# Patient Record
Sex: Male | Born: 1980 | Race: White | Hispanic: No | Marital: Married | Smoking: Former smoker
Health system: Southern US, Community
[De-identification: ages and names within clinical notes are randomized; demographics above are authoritative.]

## PROBLEM LIST (undated history)

## (undated) DIAGNOSIS — Z9889 Other specified postprocedural states: Secondary | ICD-10-CM

## (undated) DIAGNOSIS — K219 Gastro-esophageal reflux disease without esophagitis: Secondary | ICD-10-CM

## (undated) DIAGNOSIS — Z8619 Personal history of other infectious and parasitic diseases: Secondary | ICD-10-CM

## (undated) DIAGNOSIS — E785 Hyperlipidemia, unspecified: Secondary | ICD-10-CM

## (undated) DIAGNOSIS — N419 Inflammatory disease of prostate, unspecified: Secondary | ICD-10-CM

## (undated) DIAGNOSIS — R7611 Nonspecific reaction to tuberculin skin test without active tuberculosis: Secondary | ICD-10-CM

## (undated) DIAGNOSIS — K602 Anal fissure, unspecified: Secondary | ICD-10-CM

## (undated) DIAGNOSIS — I499 Cardiac arrhythmia, unspecified: Secondary | ICD-10-CM

## (undated) DIAGNOSIS — R112 Nausea with vomiting, unspecified: Secondary | ICD-10-CM

## (undated) DIAGNOSIS — M84359A Stress fracture, hip, unspecified, initial encounter for fracture: Secondary | ICD-10-CM

## (undated) HISTORY — DX: Other specified postprocedural states: Z98.890

## (undated) HISTORY — DX: Cardiac arrhythmia, unspecified: I49.9

## (undated) HISTORY — DX: Stress fracture, hip, unspecified, initial encounter for fracture: M84.359A

## (undated) HISTORY — PX: CYST REMOVAL PEDIATRIC: SHX6282

## (undated) HISTORY — DX: Anal fissure, unspecified: K60.2

## (undated) HISTORY — DX: Personal history of other infectious and parasitic diseases: Z86.19

## (undated) HISTORY — DX: Nonspecific reaction to tuberculin skin test without active tuberculosis: R76.11

## (undated) HISTORY — DX: Inflammatory disease of prostate, unspecified: N41.9

## (undated) HISTORY — PX: CARDIAC ELECTROPHYSIOLOGY MAPPING AND ABLATION: SHX1292

## (undated) HISTORY — DX: Gastro-esophageal reflux disease without esophagitis: K21.9

## (undated) HISTORY — DX: Hyperlipidemia, unspecified: E78.5

## (undated) HISTORY — DX: Nausea with vomiting, unspecified: R11.2

---

## 2005-08-02 DIAGNOSIS — R7611 Nonspecific reaction to tuberculin skin test without active tuberculosis: Secondary | ICD-10-CM

## 2005-08-02 HISTORY — DX: Nonspecific reaction to tuberculin skin test without active tuberculosis: R76.11

## 2008-08-02 HISTORY — PX: SPERMATOCELECTOMY: SHX2420

## 2015-02-20 ENCOUNTER — Encounter: Payer: Self-pay | Admitting: Family Medicine

## 2015-02-20 ENCOUNTER — Ambulatory Visit (INDEPENDENT_AMBULATORY_CARE_PROVIDER_SITE_OTHER): Payer: BLUE CROSS/BLUE SHIELD | Admitting: Family Medicine

## 2015-02-20 VITALS — BP 100/52 | HR 60 | Temp 97.5°F | Ht 72.75 in | Wt 197.0 lb

## 2015-02-20 DIAGNOSIS — Z7189 Other specified counseling: Secondary | ICD-10-CM

## 2015-02-20 DIAGNOSIS — M545 Low back pain: Secondary | ICD-10-CM | POA: Diagnosis not present

## 2015-02-20 DIAGNOSIS — Z9889 Other specified postprocedural states: Secondary | ICD-10-CM | POA: Diagnosis not present

## 2015-02-20 DIAGNOSIS — E7801 Familial hypercholesterolemia: Secondary | ICD-10-CM | POA: Insufficient documentation

## 2015-02-20 DIAGNOSIS — E785 Hyperlipidemia, unspecified: Secondary | ICD-10-CM | POA: Diagnosis not present

## 2015-02-20 DIAGNOSIS — Z7689 Persons encountering health services in other specified circumstances: Secondary | ICD-10-CM

## 2015-02-20 DIAGNOSIS — M5442 Lumbago with sciatica, left side: Secondary | ICD-10-CM

## 2015-02-20 DIAGNOSIS — Z8679 Personal history of other diseases of the circulatory system: Secondary | ICD-10-CM

## 2015-02-20 DIAGNOSIS — G8929 Other chronic pain: Secondary | ICD-10-CM | POA: Insufficient documentation

## 2015-02-20 MED ORDER — CRESTOR 20 MG PO TABS: ORAL_TABLET | ORAL | Status: DC

## 2015-02-20 NOTE — Progress Notes (Signed)
Pre visit review using our clinic review tool, if applicable. No additional management support is needed unless otherwise documented below in the visit note. 

## 2015-02-20 NOTE — Patient Instructions (Signed)
BEFORE YOU LEAVE: -schedule physical exam in 3 months - come fasting, can have water and/or black coffee -xray sheet -low back exercises  Get the xrays for your back - if ok, please start the exercises and do these at least 4 days per week - call if any worsening or new symptoms or if not helping  We recommend the following healthy lifestyle measures: - eat a healthy diet consisting of lots of vegetables, fruits, beans, nuts, seeds, healthy meats such as white chicken and fish and whole grains.  - avoid fried foods, fast food, processed foods, sodas, red meet and other fattening foods.  - get a least 150 minutes of aerobic exercise per week.
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
## 2015-02-28 ENCOUNTER — Ambulatory Visit (INDEPENDENT_AMBULATORY_CARE_PROVIDER_SITE_OTHER)
Admission: RE | Admit: 2015-02-28 | Discharge: 2015-02-28 | Disposition: A | Discharge status: Home / Self Care | Payer: BLUE CROSS/BLUE SHIELD | Source: Ambulatory Visit | Attending: Family Medicine | Admitting: Family Medicine

## 2015-02-28 DIAGNOSIS — M545 Low back pain: Secondary | ICD-10-CM

## 2015-05-30 ENCOUNTER — Ambulatory Visit (INDEPENDENT_AMBULATORY_CARE_PROVIDER_SITE_OTHER): Payer: BLUE CROSS/BLUE SHIELD | Admitting: Family Medicine

## 2015-05-30 ENCOUNTER — Encounter: Payer: Self-pay | Admitting: Family Medicine

## 2015-05-30 VITALS — BP 98/70 | HR 59 | Temp 98.5°F | Ht 72.75 in | Wt 192.2 lb

## 2015-05-30 DIAGNOSIS — K122 Cellulitis and abscess of mouth: Secondary | ICD-10-CM | POA: Diagnosis not present

## 2015-05-30 DIAGNOSIS — D239 Other benign neoplasm of skin, unspecified: Secondary | ICD-10-CM

## 2015-05-30 DIAGNOSIS — D229 Melanocytic nevi, unspecified: Secondary | ICD-10-CM

## 2015-05-30 DIAGNOSIS — J069 Acute upper respiratory infection, unspecified: Secondary | ICD-10-CM

## 2015-05-30 DIAGNOSIS — Z9889 Other specified postprocedural states: Secondary | ICD-10-CM

## 2015-05-30 DIAGNOSIS — Z0000 Encounter for general adult medical examination without abnormal findings: Secondary | ICD-10-CM

## 2015-05-30 DIAGNOSIS — E785 Hyperlipidemia, unspecified: Secondary | ICD-10-CM | POA: Diagnosis not present

## 2015-05-30 DIAGNOSIS — Z8679 Personal history of other diseases of the circulatory system: Secondary | ICD-10-CM

## 2015-05-30 LAB — LIPID PANEL
CHOL/HDL RATIO: 5
Cholesterol: 201 mg/dL — ABNORMAL HIGH (ref 0–200)
HDL: 38 mg/dL — AB (ref >39.00)
LDL Cholesterol: 150 mg/dL — ABNORMAL HIGH (ref 0–99)
NONHDL: 162.65
Triglycerides: 65 mg/dL (ref 0.0–149.0)
VLDL: 13 mg/dL (ref 0.0–40.0)

## 2015-05-30 LAB — HEMOGLOBIN A1C: Hgb A1c MFr Bld: 5.2 % (ref 4.6–6.5)

## 2015-05-30 NOTE — Progress Notes (Signed)
Pre visit review using our clinic review tool, if applicable. No additional management support is needed unless otherwise documented below in the visit note. 

## 2015-05-30 NOTE — Patient Instructions (Signed)
BEFORE YOU LEAVE: -labs -follow up in 4 weeks to recheck throat  We recommend the following healthy lifestyle measures: - eat a healthy whole foods diet consisting of regular small meals composed of vegetables, fruits, beans, nuts, seeds, healthy meats such as white chicken and fish and whole grains.  - avoid sweets, white starchy foods, fried foods, fast food, processed foods, sodas, red meet and other fattening foods.  - get a least 150-300 minutes of aerobic exercise per week.   -We have ordered labs or studies at this visit. It can take up to 1-2 weeks for results and processing. We will contact you with instructions IF your results are abnormal. Normal results will be released to your [facility]. If you have not heard from [place] or can not find your results in [facility] in 2 weeks please contact our office.
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
## 2015-06-19 ENCOUNTER — Encounter: Payer: Self-pay | Admitting: Family Medicine

## 2015-06-19 ENCOUNTER — Ambulatory Visit (INDEPENDENT_AMBULATORY_CARE_PROVIDER_SITE_OTHER): Payer: 59 | Admitting: Family Medicine

## 2015-06-19 VITALS — BP 98/62 | HR 84 | Temp 98.4°F | Ht 72.75 in | Wt 193.5 lb

## 2015-06-19 DIAGNOSIS — K122 Cellulitis and abscess of mouth: Secondary | ICD-10-CM | POA: Diagnosis not present

## 2015-06-19 DIAGNOSIS — J069 Acute upper respiratory infection, unspecified: Secondary | ICD-10-CM | POA: Diagnosis not present

## 2015-06-19 MED ORDER — AMOXICILLIN-POT CLAVULANATE 875-125 MG PO TABS: 1.0000 | ORAL_TABLET | Freq: Two times a day (BID) | ORAL | Status: DC

## 2015-06-19 NOTE — Patient Instructions (Signed)
Please take the antibiotic as instructed. If uvula remains swollen call for evaluation with the Ear, Nose and Throat specialist.  INSTRUCTIONS FOR UPPER RESPIRATORY INFECTION:  -plenty of rest and fluids  -nasal saline wash 2-3 times daily (use prepackaged nasal saline or bottled/distilled water if making your own)   -can use AFRIN nasal spray for drainage and nasal congestion - but do NOT use longer then 3-4 days  -can use tylenol (in no history of liver disease) or ibuprofen (if no history of kidney disease, bowel bleeding or significant heart disease) as directed for aches and sorethroat  -in the winter time, using a humidifier at night is helpful (please follow cleaning instructions)  -if you are taking a cough medication - use only as directed, may also try a teaspoon of honey to coat the throat and throat lozenges. If given a cough medication with codeine or hydrocodone or other narcotic please be advised that this contains a strong and  potentially addicting medication. Please follow instructions carefully, take as little as possible and only use AS NEEDED for severe cough. Discuss potential side effects with your pharmacy. Please do not drive or operate machinery while taking these types of medications. Please do not take other sedating medications, drugs or alcohol while taking this medication without discussing with your doctor.  -for sore throat, salt water gargles can help  -follow up if you have fevers, facial pain, tooth pain, difficulty breathing or are worsening or symptoms persist longer then expected  Upper Respiratory Infection, Adult An upper respiratory infection (URI) is also known as the common cold. It is often caused by a type of germ (virus). Colds are easily spread (contagious). You can pass it to others by kissing, coughing, sneezing, or drinking out of the same glass. Usually, you get better in 1 to 3  weeks.  However, the cough can last for even longer. HOME CARE     Only take medicine as told by your doctor. Follow instructions provided above.  Drink enough water and fluids to keep your pee (urine) clear or pale yellow.  Get plenty of rest.  Return to work when your temperature is < 100 for 24 hours or as told by your doctor. You may use a face mask and wash your hands to stop your cold from spreading. GET HELP RIGHT AWAY IF:   After the first few days, you feel you are getting worse.  You have questions about your medicine.  You have chills, shortness of breath, or red spit (mucus).  You have pain in the face for more then 1-2 days, especially when you bend forward.  You have a fever, puffy (swollen) neck, pain when you swallow, or white spots in the back of your throat.  You have a bad headache, ear pain, sinus pain, or chest pain.  You have a high-pitched whistling sound when you breathe in and out (wheezing).  You cough up blood.  You have sore muscles or a stiff neck. MAKE SURE YOU:   Understand these instructions.  Will watch your condition.  Will get help right away if you are not doing well or get worse. Document Released: 01/05/2008 Document Revised: 10/11/2011 Document Reviewed: 10/24/2013 [facility] Patient Information 2015 [place], [place]. This information is not intended to replace advice given to you by your health care provider. Make sure you discuss any questions you have with your health care provider.
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
## 2015-06-19 NOTE — Progress Notes (Signed)
Pre visit review using our clinic review tool, if applicable. No additional management support is needed unless otherwise documented below in the visit note. 

## 2015-06-30 ENCOUNTER — Ambulatory Visit: Payer: Self-pay | Admitting: Family Medicine

## 2015-07-09 ENCOUNTER — Telehealth: Payer: Self-pay | Admitting: Family Medicine

## 2015-07-09 ENCOUNTER — Other Ambulatory Visit: Payer: Self-pay | Admitting: Family Medicine

## 2015-07-09 MED ORDER — CRESTOR 20 MG PO TABS: ORAL_TABLET | ORAL | Status: DC

## 2015-07-09 NOTE — Telephone Encounter (Signed)
Mr. Liechty called saying his pharmacy told him they can't fill his Crestor Rx due to his insurance carrier asking if he has to have that brand as opposed to having a generic. Mr. Waldner said he's fine with taking whatever brand Dr. Maudie Mercury suggests. He's down to two pills. He'd like a phone call regarding this.   Pt ph# [phone] Thank you.

## 2015-07-10 MED ORDER — ROSUVASTATIN CALCIUM 20 MG PO TABS: 20.0000 mg | ORAL_TABLET | Freq: Every day | ORAL | Status: DC

## 2015-07-10 NOTE — Telephone Encounter (Signed)
I called the pharmacy and per Jone Baseman the Rx was sent in as brand only and the insurance may not cover this and if the doctor does not mind, the generic can be sent in. I spoke with Dr Maudie Mercury and she stated the generic is fine if the pt does not mind and the pt stated he will try this. A new Rx was sent for the generic Rx.

## 2015-11-13 ENCOUNTER — Encounter: Payer: Self-pay | Admitting: Family Medicine

## 2015-11-13 ENCOUNTER — Ambulatory Visit (INDEPENDENT_AMBULATORY_CARE_PROVIDER_SITE_OTHER): Payer: 59 | Admitting: Family Medicine

## 2015-11-13 VITALS — BP 100/50 | HR 62 | Temp 98.5°F | Ht 72.75 in | Wt 193.3 lb

## 2015-11-13 DIAGNOSIS — J309 Allergic rhinitis, unspecified: Secondary | ICD-10-CM | POA: Diagnosis not present

## 2015-11-13 MED ORDER — FLUTICASONE PROPIONATE 50 MCG/ACT NA SUSP: 2.0000 | Freq: Every day | NASAL | Status: DC

## 2015-11-13 NOTE — Progress Notes (Signed)
Pre visit review using our clinic review tool, if applicable. No additional management support is needed unless otherwise documented below in the visit note. 
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
## 2015-11-13 NOTE — Patient Instructions (Signed)
Please start zyrtec OR claritin once daily. This is available over the counter.  Please start flonase 2 sprays each nostril daily for 21 days  Follow up with me or your Ear, Nose and throat specialist if symptoms persist in 2-3 weeks.

## 2016-01-08 ENCOUNTER — Other Ambulatory Visit: Payer: Self-pay | Admitting: Family Medicine

## 2016-06-09 ENCOUNTER — Other Ambulatory Visit: Payer: Self-pay | Admitting: Family Medicine
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
## 2016-08-30 ENCOUNTER — Encounter: Payer: Self-pay | Admitting: Family Medicine

## 2016-08-30 ENCOUNTER — Ambulatory Visit (INDEPENDENT_AMBULATORY_CARE_PROVIDER_SITE_OTHER): Payer: 59 | Admitting: Family Medicine

## 2016-08-30 VITALS — BP 98/62 | HR 47 | Temp 98.0°F | Ht 72.75 in | Wt 196.7 lb

## 2016-08-30 DIAGNOSIS — M79671 Pain in right foot: Secondary | ICD-10-CM | POA: Insufficient documentation

## 2016-08-30 DIAGNOSIS — M5442 Lumbago with sciatica, left side: Secondary | ICD-10-CM | POA: Diagnosis not present

## 2016-08-30 DIAGNOSIS — E785 Hyperlipidemia, unspecified: Secondary | ICD-10-CM | POA: Diagnosis not present

## 2016-08-30 DIAGNOSIS — G8929 Other chronic pain: Secondary | ICD-10-CM

## 2016-08-30 DIAGNOSIS — K649 Unspecified hemorrhoids: Secondary | ICD-10-CM | POA: Diagnosis not present

## 2016-08-30 LAB — LIPID PANEL
Cholesterol: 185 mg/dL (ref 0–200)
HDL: 45.9 mg/dL (ref >39.00)
LDL CALC: 129 mg/dL — AB (ref 0–99)
NonHDL: 138.8
Total CHOL/HDL Ratio: 4
Triglycerides: 50 mg/dL (ref 0.0–149.0)
VLDL: 10 mg/dL (ref 0.0–40.0)

## 2016-08-30 MED ORDER — HYDROCORTISONE 2.5 % RE CREA: 1.0000 "application " | TOPICAL_CREAM | Freq: Two times a day (BID) | RECTAL | 0 refills | Status: DC

## 2016-08-30 NOTE — Progress Notes (Signed)
Pre visit review using our clinic review tool, if applicable. No additional management support is needed unless otherwise documented below in the visit note. 

## 2016-08-30 NOTE — Patient Instructions (Signed)
BEFORE YOU LEAVE: -labs -follow up: 3 months  -We placed a referral for you as discussed to the back specialist regarding the back pan. It usually takes about 1-2 weeks to process and schedule this referral. If you have not heard from [place] regarding this appointment in 2 weeks please contact our office.  Try a strassburg sock and gait analysis for the foot pain.  Use the cream provided if needed for hemorrhoids. Please come in for acute visit when you have a flare.  We have ordered labs or studies at this visit. It can take up to 1-2 weeks for results and processing. IF results require follow up or explanation, we will call you with instructions. Clinically stable results will be released to your [facility]. If you have not heard from [place] or cannot find your results in [facility] in 2 weeks please contact our office at [phone].  If you are not yet signed up for [facility], please consider signing up.   We recommend the following healthy lifestyle for LIFE: 1) Small portions.   Tip: eat off of a salad plate instead of a dinner plate.  Tip: It is ok to feel hungry after a meal - that likely means you ate an appropriate portion.  Tip: if you need more or a snack choose fruits, veggies and/or a handful of nuts or seeds.  2) Eat a healthy clean diet.  * Tip: Avoid (less then 1 serving per week): processed foods, sweets, sweetened drinks, white starches (rice, flour, bread, potatoes, pasta, etc), red meat, fast foods, butter  *Tip: CHOOSE instead   * 5-9 servings per day of fresh or frozen fruits and vegetables (but not corn, potatoes, bananas, canned or dried fruit)   *nuts and seeds, beans   *olives and olive oil   *small portions of lean meats such as fish and white chicken    *small portions of whole grains  3)Get at least 150 minutes of sweaty aerobic exercise per week.  4)Reduce stress - consider counseling, meditation and relaxation to balance other aspects of your life.

## 2016-09-02 ENCOUNTER — Other Ambulatory Visit: Payer: Self-pay | Admitting: Family Medicine

## 2016-09-14 ENCOUNTER — Encounter: Payer: Self-pay | Admitting: Family Medicine

## 2016-09-14 ENCOUNTER — Ambulatory Visit (INDEPENDENT_AMBULATORY_CARE_PROVIDER_SITE_OTHER): Payer: 59 | Admitting: Family Medicine

## 2016-09-14 VITALS — BP 100/80 | HR 72 | Temp 98.5°F | Ht 72.75 in | Wt 192.0 lb

## 2016-09-14 DIAGNOSIS — R509 Fever, unspecified: Secondary | ICD-10-CM

## 2016-09-14 LAB — POCT INFLUENZA A/B
Influenza A, POC: NEGATIVE
Influenza B, POC: NEGATIVE

## 2016-09-14 MED ORDER — OSELTAMIVIR PHOSPHATE 75 MG PO CAPS: 75.0000 mg | ORAL_CAPSULE | Freq: Two times a day (BID) | ORAL | 0 refills | Status: DC

## 2016-09-14 NOTE — Patient Instructions (Signed)

## 2016-09-14 NOTE — Progress Notes (Signed)
Pre visit review using our clinic review tool, if applicable. No additional management support is needed unless otherwise documented below in the visit note. 
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
## 2016-09-20 ENCOUNTER — Telehealth: Payer: Self-pay

## 2016-09-20 NOTE — Telephone Encounter (Signed)
error 

## 2016-09-23 ENCOUNTER — Ambulatory Visit (INDEPENDENT_AMBULATORY_CARE_PROVIDER_SITE_OTHER): Payer: 59 | Admitting: Family Medicine

## 2016-09-23 ENCOUNTER — Encounter: Payer: Self-pay | Admitting: Family Medicine

## 2016-09-23 ENCOUNTER — Encounter: Payer: Self-pay | Admitting: Internal Medicine

## 2016-09-23 VITALS — BP 98/62 | HR 55 | Temp 98.1°F | Ht 72.75 in | Wt 192.9 lb

## 2016-09-23 DIAGNOSIS — K6289 Other specified diseases of anus and rectum: Secondary | ICD-10-CM

## 2016-09-23 DIAGNOSIS — K625 Hemorrhage of anus and rectum: Secondary | ICD-10-CM | POA: Diagnosis not present
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
## 2016-09-23 NOTE — Patient Instructions (Signed)
-  We placed a referral for you as discussed to the gastroenterologist. It usually takes about 1-2 weeks to process and schedule this referral. If you have not heard from [place] regarding this appointment in 2 weeks please contact our office.  -Can stop the steroid cream for now and use only if needed

## 2016-09-23 NOTE — Progress Notes (Signed)
Pre visit review using our clinic review tool, if applicable. No additional management support is needed unless otherwise documented below in the visit note. 

## 2016-10-14 ENCOUNTER — Encounter: Payer: Self-pay | Admitting: Physical Medicine & Rehabilitation

## 2016-10-27 ENCOUNTER — Encounter: Payer: Self-pay | Admitting: Internal Medicine

## 2016-10-27 ENCOUNTER — Ambulatory Visit (INDEPENDENT_AMBULATORY_CARE_PROVIDER_SITE_OTHER): Payer: 59 | Admitting: Internal Medicine

## 2016-10-27 VITALS — BP 110/60 | HR 60 | Ht 72.0 in | Wt 193.0 lb

## 2016-10-27 DIAGNOSIS — K602 Anal fissure, unspecified: Secondary | ICD-10-CM | POA: Diagnosis not present

## 2016-10-27 MED ORDER — DILTIAZEM GEL 2 %: 1.0000 "application " | Freq: Every day | CUTANEOUS | 2 refills | Status: DC

## 2016-10-27 NOTE — Patient Instructions (Addendum)
We have sent the following medications to [facility] for you to pick up at your convenience:  Diltizem Gel  Take 2 tablespoons of Metamucil daily  Take sitz baths
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
## 2016-11-05 ENCOUNTER — Encounter: Payer: 59 | Attending: Physical Medicine & Rehabilitation | Admitting: Physical Medicine & Rehabilitation

## 2016-11-05 ENCOUNTER — Encounter: Payer: Self-pay | Admitting: Physical Medicine & Rehabilitation

## 2016-11-05 VITALS — BP 106/70 | HR 46

## 2016-11-05 DIAGNOSIS — M5442 Lumbago with sciatica, left side: Secondary | ICD-10-CM | POA: Diagnosis not present

## 2016-11-05 DIAGNOSIS — G8929 Other chronic pain: Secondary | ICD-10-CM | POA: Diagnosis present

## 2016-11-05 DIAGNOSIS — M791 Myalgia, unspecified site: Secondary | ICD-10-CM

## 2016-11-05 MED ORDER — IBUPROFEN 600 MG PO TABS: 600.0000 mg | ORAL_TABLET | Freq: Every day | ORAL | 1 refills | Status: DC | PRN

## 2016-11-05 MED ORDER — METHOCARBAMOL 500 MG PO TABS: 500.0000 mg | ORAL_TABLET | Freq: Two times a day (BID) | ORAL | 1 refills | Status: DC | PRN
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
## 2016-11-19 ENCOUNTER — Telehealth: Payer: Self-pay | Admitting: *Deleted

## 2016-11-19 DIAGNOSIS — G8929 Other chronic pain: Secondary | ICD-10-CM

## 2016-11-19 DIAGNOSIS — M5442 Lumbago with sciatica, left side: Principal | ICD-10-CM

## 2016-11-19 NOTE — Telephone Encounter (Signed)
New order placed for without contrast.

## 2016-11-24 ENCOUNTER — Telehealth: Payer: Self-pay | Admitting: Family Medicine

## 2016-11-24 NOTE — Telephone Encounter (Signed)
Pt would like to know if he should keep the appointment for Monday due to him going to other providers?

## 2016-11-25 ENCOUNTER — Ambulatory Visit (HOSPITAL_COMMUNITY)
Admission: RE | Admit: 2016-11-25 | Discharge: 2016-11-25 | Disposition: A | Discharge status: Home / Self Care | Payer: 59 | Source: Ambulatory Visit | Attending: Physical Medicine & Rehabilitation | Admitting: Physical Medicine & Rehabilitation

## 2016-11-25 DIAGNOSIS — M5126 Other intervertebral disc displacement, lumbar region: Secondary | ICD-10-CM | POA: Diagnosis not present

## 2016-11-25 DIAGNOSIS — G8929 Other chronic pain: Secondary | ICD-10-CM | POA: Diagnosis not present

## 2016-11-25 DIAGNOSIS — M5442 Lumbago with sciatica, left side: Secondary | ICD-10-CM | POA: Diagnosis not present

## 2016-11-25 NOTE — Telephone Encounter (Signed)
See prior note

## 2016-11-25 NOTE — Telephone Encounter (Signed)
I left a detailed message per Dr Maudie Mercury he can cancel the appt if this would be for the same problem he has seen the specialist for and asked that the pt return my call if he would like to cancel the appt he has and to schedule a physical only.

## 2016-11-25 NOTE — Telephone Encounter (Signed)
Can you call pt? If seeing the specialist for any concerns - can cancel and just schedule his next physical. Thanks!

## 2016-11-29 ENCOUNTER — Ambulatory Visit: Payer: 59 | Admitting: Family Medicine
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
## 2016-11-30 ENCOUNTER — Encounter: Payer: Self-pay | Admitting: Family Medicine

## 2016-11-30 ENCOUNTER — Ambulatory Visit (INDEPENDENT_AMBULATORY_CARE_PROVIDER_SITE_OTHER): Payer: 59 | Admitting: Family Medicine

## 2016-11-30 VITALS — BP 98/60 | HR 50 | Temp 98.4°F | Ht 74.0 in | Wt 194.5 lb

## 2016-11-30 DIAGNOSIS — Z0000 Encounter for general adult medical examination without abnormal findings: Secondary | ICD-10-CM | POA: Diagnosis not present

## 2016-11-30 DIAGNOSIS — E785 Hyperlipidemia, unspecified: Secondary | ICD-10-CM | POA: Diagnosis not present

## 2016-11-30 LAB — LIPID PANEL
Cholesterol: 185 mg/dL (ref 0–200)
HDL: 44.9 mg/dL (ref >39.00)
LDL CALC: 127 mg/dL — AB (ref 0–99)
NONHDL: 140.46
Total CHOL/HDL Ratio: 4
Triglycerides: 67 mg/dL (ref 0.0–149.0)
VLDL: 13.4 mg/dL (ref 0.0–40.0)

## 2016-11-30 NOTE — Patient Instructions (Signed)
BEFORE YOU LEAVE: -follow up: yearly for physical and as needed -lab for cholesterol check  We have ordered labs or studies at this visit. It can take up to 1-2 weeks for results and processing. IF results require follow up or explanation, we will call you with instructions. Clinically stable results will be released to your [facility]. If you have not heard from [place] or cannot find your results in [facility] in 2 weeks please contact our office at [phone].  If you are not yet signed up for [facility], please consider signing up.  Follow up with the dermatologist for the skin and nail check.  Follow up with the neurologist as planned.  We recommend the following healthy lifestyle for LIFE: 1) Small portions.   Tip: eat off of a salad plate instead of a dinner plate.  Tip: if you need more or a snack choose fruits, veggies and/or a handful of nuts or seeds.  2) Eat a healthy clean diet.  * Tip: Avoid (less then 1 serving per week): processed foods, sweets, sweetened drinks, white starches (rice, flour, bread, potatoes, pasta, etc), red meat, fast foods, butter  *Tip: CHOOSE instead   * 5-9 servings per day of fresh or frozen fruits and vegetables (but not corn, potatoes, bananas, canned or dried fruit)   *nuts and seeds, beans   *olives and olive oil   *small portions of lean meats such as fish and white chicken    *small portions of whole grains  3)Get at least 150 minutes of sweaty aerobic exercise per week.  4)Reduce stress - consider counseling, meditation and relaxation to balance other aspects of your life.

## 2016-11-30 NOTE — Progress Notes (Signed)
Pre visit review using our clinic review tool, if applicable. No additional management support is needed unless otherwise documented below in the visit note. 

## 2016-12-03 ENCOUNTER — Encounter: Payer: 59 | Attending: Physical Medicine & Rehabilitation | Admitting: Physical Medicine & Rehabilitation

## 2016-12-03 ENCOUNTER — Encounter: Payer: Self-pay | Admitting: Physical Medicine & Rehabilitation

## 2016-12-03 VITALS — BP 112/72 | HR 55

## 2016-12-03 DIAGNOSIS — M5442 Lumbago with sciatica, left side: Secondary | ICD-10-CM

## 2016-12-03 DIAGNOSIS — M791 Myalgia, unspecified site: Secondary | ICD-10-CM

## 2016-12-03 DIAGNOSIS — G8929 Other chronic pain: Secondary | ICD-10-CM | POA: Diagnosis not present

## 2016-12-03 MED ORDER — GABAPENTIN 100 MG PO CAPS: 100.0000 mg | ORAL_CAPSULE | Freq: Three times a day (TID) | ORAL | 1 refills | Status: DC
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
## 2017-01-14 ENCOUNTER — Encounter: Payer: 59 | Admitting: Physical Medicine & Rehabilitation

## 2017-01-20 ENCOUNTER — Encounter: Payer: 59 | Admitting: Physical Medicine & Rehabilitation

## 2017-01-20 ENCOUNTER — Ambulatory Visit: Payer: 59 | Attending: Physical Medicine & Rehabilitation | Admitting: Physical Therapy

## 2017-01-20 ENCOUNTER — Encounter: Payer: Self-pay | Admitting: Physical Therapy

## 2017-01-20 DIAGNOSIS — M545 Low back pain: Secondary | ICD-10-CM | POA: Diagnosis present

## 2017-01-20 DIAGNOSIS — G8929 Other chronic pain: Secondary | ICD-10-CM | POA: Diagnosis present

## 2017-01-20 DIAGNOSIS — M62838 Other muscle spasm: Secondary | ICD-10-CM | POA: Insufficient documentation
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
## 2017-01-28 ENCOUNTER — Ambulatory Visit: Payer: 59 | Admitting: Physical Therapy

## 2017-01-28 ENCOUNTER — Encounter: Payer: Self-pay | Admitting: Physical Therapy

## 2017-01-28 DIAGNOSIS — M62838 Other muscle spasm: Secondary | ICD-10-CM

## 2017-01-28 DIAGNOSIS — M545 Low back pain: Secondary | ICD-10-CM | POA: Diagnosis not present

## 2017-01-28 DIAGNOSIS — G8929 Other chronic pain: Secondary | ICD-10-CM
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
## 2017-02-10 ENCOUNTER — Ambulatory Visit: Payer: 59 | Admitting: Physical Therapy

## 2017-02-16 ENCOUNTER — Encounter: Payer: Self-pay | Admitting: Physical Therapy

## 2017-02-16 ENCOUNTER — Ambulatory Visit: Payer: 59 | Attending: Physical Medicine & Rehabilitation | Admitting: Physical Therapy

## 2017-02-16 DIAGNOSIS — G8929 Other chronic pain: Secondary | ICD-10-CM | POA: Insufficient documentation

## 2017-02-16 DIAGNOSIS — M545 Low back pain: Secondary | ICD-10-CM | POA: Diagnosis not present

## 2017-02-16 DIAGNOSIS — M62838 Other muscle spasm: Secondary | ICD-10-CM | POA: Insufficient documentation
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
## 2017-02-18 ENCOUNTER — Encounter: Payer: Self-pay | Admitting: Physical Medicine & Rehabilitation

## 2017-02-18 ENCOUNTER — Encounter: Payer: 59 | Attending: Physical Medicine & Rehabilitation | Admitting: Physical Medicine & Rehabilitation

## 2017-02-18 VITALS — BP 106/69 | HR 64

## 2017-02-18 DIAGNOSIS — G8929 Other chronic pain: Secondary | ICD-10-CM | POA: Insufficient documentation

## 2017-02-18 DIAGNOSIS — M791 Myalgia, unspecified site: Secondary | ICD-10-CM

## 2017-02-18 DIAGNOSIS — M5442 Lumbago with sciatica, left side: Secondary | ICD-10-CM | POA: Insufficient documentation

## 2017-02-18 MED ORDER — IBUPROFEN 600 MG PO TABS: 600.0000 mg | ORAL_TABLET | Freq: Every day | ORAL | 2 refills | Status: DC | PRN
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
## 2017-02-23 ENCOUNTER — Encounter: Payer: Self-pay | Admitting: Physical Therapy

## 2017-02-23 ENCOUNTER — Ambulatory Visit: Payer: 59 | Admitting: Physical Therapy

## 2017-02-23 DIAGNOSIS — M62838 Other muscle spasm: Secondary | ICD-10-CM

## 2017-02-23 DIAGNOSIS — M545 Low back pain: Secondary | ICD-10-CM | POA: Diagnosis not present

## 2017-02-23 DIAGNOSIS — G8929 Other chronic pain: Secondary | ICD-10-CM

## 2017-02-23 NOTE — Patient Instructions (Signed)
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
## 2017-03-10 ENCOUNTER — Ambulatory Visit: Payer: 59 | Attending: Physical Medicine & Rehabilitation

## 2017-03-10 DIAGNOSIS — G8929 Other chronic pain: Secondary | ICD-10-CM | POA: Diagnosis present

## 2017-03-10 DIAGNOSIS — M545 Low back pain: Secondary | ICD-10-CM | POA: Diagnosis not present

## 2017-03-10 DIAGNOSIS — M62838 Other muscle spasm: Secondary | ICD-10-CM | POA: Insufficient documentation

## 2017-03-10 NOTE — Addendum Note (Signed)
Addended by: Darrel Hoover on: 03/10/2017 09:34 AM   Modules accepted: Orders
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
## 2017-03-16 ENCOUNTER — Encounter: Payer: Self-pay | Admitting: Physical Therapy

## 2017-03-16 ENCOUNTER — Ambulatory Visit: Payer: 59 | Admitting: Physical Therapy

## 2017-03-16 DIAGNOSIS — M62838 Other muscle spasm: Secondary | ICD-10-CM

## 2017-03-16 DIAGNOSIS — M545 Low back pain: Secondary | ICD-10-CM | POA: Diagnosis not present

## 2017-03-16 DIAGNOSIS — G8929 Other chronic pain: Secondary | ICD-10-CM
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
## 2017-03-22 ENCOUNTER — Encounter: Payer: Self-pay | Admitting: Physical Therapy

## 2017-03-22 ENCOUNTER — Ambulatory Visit: Payer: 59 | Admitting: Physical Therapy

## 2017-03-22 DIAGNOSIS — G8929 Other chronic pain: Secondary | ICD-10-CM

## 2017-03-22 DIAGNOSIS — M62838 Other muscle spasm: Secondary | ICD-10-CM

## 2017-03-22 DIAGNOSIS — M545 Low back pain: Secondary | ICD-10-CM
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
## 2017-03-30 ENCOUNTER — Ambulatory Visit: Payer: 59 | Admitting: Physical Therapy

## 2017-03-30 ENCOUNTER — Encounter: Payer: Self-pay | Admitting: Physical Therapy

## 2017-03-30 DIAGNOSIS — M62838 Other muscle spasm: Secondary | ICD-10-CM

## 2017-03-30 DIAGNOSIS — G8929 Other chronic pain: Secondary | ICD-10-CM

## 2017-03-30 DIAGNOSIS — M545 Low back pain: Secondary | ICD-10-CM | POA: Diagnosis not present
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
## 2017-04-06 ENCOUNTER — Ambulatory Visit: Payer: 59

## 2017-04-07 ENCOUNTER — Ambulatory Visit: Payer: 59 | Attending: Physical Medicine & Rehabilitation | Admitting: Physical Therapy

## 2017-04-07 ENCOUNTER — Encounter: Payer: Self-pay | Admitting: Physical Therapy

## 2017-04-07 DIAGNOSIS — M62838 Other muscle spasm: Secondary | ICD-10-CM | POA: Insufficient documentation

## 2017-04-07 DIAGNOSIS — G8929 Other chronic pain: Secondary | ICD-10-CM | POA: Diagnosis present

## 2017-04-07 DIAGNOSIS — M545 Low back pain: Secondary | ICD-10-CM | POA: Insufficient documentation
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
## 2017-04-13 ENCOUNTER — Encounter: Payer: Self-pay | Admitting: Physical Therapy

## 2017-04-13 ENCOUNTER — Ambulatory Visit: Payer: 59 | Admitting: Physical Therapy

## 2017-04-13 DIAGNOSIS — M62838 Other muscle spasm: Secondary | ICD-10-CM

## 2017-04-13 DIAGNOSIS — G8929 Other chronic pain: Secondary | ICD-10-CM

## 2017-04-13 DIAGNOSIS — M545 Low back pain: Secondary | ICD-10-CM
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
## 2017-04-21 ENCOUNTER — Encounter: Payer: Self-pay | Admitting: Family Medicine

## 2017-04-22 ENCOUNTER — Encounter: Payer: Self-pay | Admitting: Physical Therapy

## 2017-04-22 ENCOUNTER — Ambulatory Visit: Payer: 59 | Admitting: Physical Therapy

## 2017-04-22 DIAGNOSIS — M62838 Other muscle spasm: Secondary | ICD-10-CM | POA: Diagnosis not present

## 2017-04-22 DIAGNOSIS — G8929 Other chronic pain: Secondary | ICD-10-CM

## 2017-04-22 DIAGNOSIS — M545 Low back pain: Secondary | ICD-10-CM
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
## 2017-05-20 ENCOUNTER — Encounter: Payer: 59 | Attending: Physical Medicine & Rehabilitation | Admitting: Physical Medicine & Rehabilitation

## 2017-05-20 ENCOUNTER — Encounter: Payer: Self-pay | Admitting: Physical Medicine & Rehabilitation

## 2017-05-20 VITALS — BP 131/73 | HR 59

## 2017-05-20 DIAGNOSIS — G8929 Other chronic pain: Secondary | ICD-10-CM | POA: Diagnosis not present

## 2017-05-20 DIAGNOSIS — Z9889 Other specified postprocedural states: Secondary | ICD-10-CM | POA: Insufficient documentation

## 2017-05-20 DIAGNOSIS — M791 Myalgia, unspecified site: Secondary | ICD-10-CM | POA: Diagnosis not present

## 2017-05-20 DIAGNOSIS — M2170 Unequal limb length (acquired), unspecified site: Secondary | ICD-10-CM | POA: Diagnosis not present

## 2017-05-20 DIAGNOSIS — K219 Gastro-esophageal reflux disease without esophagitis: Secondary | ICD-10-CM | POA: Diagnosis not present

## 2017-05-20 DIAGNOSIS — M5442 Lumbago with sciatica, left side: Secondary | ICD-10-CM | POA: Diagnosis not present

## 2017-05-20 DIAGNOSIS — Z87891 Personal history of nicotine dependence: Secondary | ICD-10-CM | POA: Diagnosis not present

## 2017-05-20 DIAGNOSIS — I4891 Unspecified atrial fibrillation: Secondary | ICD-10-CM | POA: Insufficient documentation

## 2017-05-20 DIAGNOSIS — E785 Hyperlipidemia, unspecified: Secondary | ICD-10-CM | POA: Diagnosis not present

## 2017-05-20 MED ORDER — METHOCARBAMOL 500 MG PO TABS: 500.0000 mg | ORAL_TABLET | Freq: Two times a day (BID) | ORAL | 1 refills | Status: DC | PRN

## 2017-05-20 MED ORDER — IBUPROFEN 600 MG PO TABS: 600.0000 mg | ORAL_TABLET | Freq: Every day | ORAL | 2 refills | Status: DC | PRN

## 2017-05-20 MED ORDER — GABAPENTIN 100 MG PO CAPS: 100.0000 mg | ORAL_CAPSULE | Freq: Three times a day (TID) | ORAL | 1 refills | Status: DC | PRN
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
## 2017-05-20 NOTE — Addendum Note (Signed)
Addended by: Delice Lesch A on: 05/20/2017 01:39 PM   Modules accepted: Level of Service

## 2017-05-23 ENCOUNTER — Other Ambulatory Visit: Payer: Self-pay | Admitting: Family Medicine

## 2017-05-23 ENCOUNTER — Telehealth: Payer: Self-pay | Admitting: Physical Medicine & Rehabilitation

## 2017-05-23 NOTE — Telephone Encounter (Signed)
Patient needs referral for orthotic expedited if possible healthcare year expiring - call to confirm you can please. [phone]

## 2017-06-30 ENCOUNTER — Encounter: Payer: Self-pay | Admitting: Physical Medicine & Rehabilitation

## 2017-06-30 ENCOUNTER — Other Ambulatory Visit: Payer: Self-pay

## 2017-06-30 ENCOUNTER — Encounter: Payer: 59 | Attending: Physical Medicine & Rehabilitation | Admitting: Physical Medicine & Rehabilitation

## 2017-06-30 ENCOUNTER — Encounter: Payer: 59 | Admitting: Physical Medicine & Rehabilitation

## 2017-06-30 VITALS — BP 109/72 | HR 60

## 2017-06-30 DIAGNOSIS — K219 Gastro-esophageal reflux disease without esophagitis: Secondary | ICD-10-CM | POA: Diagnosis not present

## 2017-06-30 DIAGNOSIS — M5442 Lumbago with sciatica, left side: Secondary | ICD-10-CM | POA: Insufficient documentation

## 2017-06-30 DIAGNOSIS — I4891 Unspecified atrial fibrillation: Secondary | ICD-10-CM | POA: Insufficient documentation

## 2017-06-30 DIAGNOSIS — G8929 Other chronic pain: Secondary | ICD-10-CM | POA: Diagnosis not present

## 2017-06-30 DIAGNOSIS — Z9889 Other specified postprocedural states: Secondary | ICD-10-CM | POA: Diagnosis not present

## 2017-06-30 DIAGNOSIS — E785 Hyperlipidemia, unspecified: Secondary | ICD-10-CM | POA: Diagnosis not present

## 2017-06-30 DIAGNOSIS — Z87891 Personal history of nicotine dependence: Secondary | ICD-10-CM | POA: Insufficient documentation

## 2017-06-30 DIAGNOSIS — M2170 Unequal limb length (acquired), unspecified site: Secondary | ICD-10-CM | POA: Diagnosis not present

## 2017-06-30 DIAGNOSIS — M791 Myalgia, unspecified site: Secondary | ICD-10-CM | POA: Diagnosis not present

## 2017-06-30 MED ORDER — LIDOCAINE 5 % EX PTCH: 1.0000 | MEDICATED_PATCH | CUTANEOUS | 0 refills | Status: DC

## 2017-06-30 MED ORDER — GABAPENTIN 300 MG PO CAPS: 300.0000 mg | ORAL_CAPSULE | Freq: Three times a day (TID) | ORAL | 1 refills | Status: DC
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
## 2017-08-03 ENCOUNTER — Other Ambulatory Visit: Payer: Self-pay

## 2017-08-03 ENCOUNTER — Encounter: Payer: 59 | Attending: Physical Medicine & Rehabilitation | Admitting: Physical Medicine & Rehabilitation

## 2017-08-03 ENCOUNTER — Encounter: Payer: Self-pay | Admitting: Physical Medicine & Rehabilitation

## 2017-08-03 VITALS — BP 112/73 | HR 47

## 2017-08-03 DIAGNOSIS — M5442 Lumbago with sciatica, left side: Secondary | ICD-10-CM | POA: Insufficient documentation

## 2017-08-03 DIAGNOSIS — M2170 Unequal limb length (acquired), unspecified site: Secondary | ICD-10-CM | POA: Diagnosis not present

## 2017-08-03 DIAGNOSIS — M791 Myalgia, unspecified site: Secondary | ICD-10-CM | POA: Diagnosis not present

## 2017-08-03 DIAGNOSIS — G8929 Other chronic pain: Secondary | ICD-10-CM | POA: Insufficient documentation

## 2017-08-03 DIAGNOSIS — Z9889 Other specified postprocedural states: Secondary | ICD-10-CM | POA: Insufficient documentation

## 2017-08-03 DIAGNOSIS — K219 Gastro-esophageal reflux disease without esophagitis: Secondary | ICD-10-CM | POA: Insufficient documentation

## 2017-08-03 DIAGNOSIS — M5417 Radiculopathy, lumbosacral region: Secondary | ICD-10-CM | POA: Diagnosis not present

## 2017-08-03 DIAGNOSIS — E785 Hyperlipidemia, unspecified: Secondary | ICD-10-CM | POA: Diagnosis not present

## 2017-08-03 DIAGNOSIS — Z87891 Personal history of nicotine dependence: Secondary | ICD-10-CM | POA: Diagnosis not present

## 2017-08-03 DIAGNOSIS — I4891 Unspecified atrial fibrillation: Secondary | ICD-10-CM | POA: Insufficient documentation

## 2017-08-03 MED ORDER — GABAPENTIN 300 MG PO CAPS: 600.0000 mg | ORAL_CAPSULE | Freq: Three times a day (TID) | ORAL | 1 refills | Status: DC

## 2017-08-03 MED ORDER — IBUPROFEN 600 MG PO TABS: 600.0000 mg | ORAL_TABLET | Freq: Every day | ORAL | 2 refills | Status: DC | PRN
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
## 2017-08-11 ENCOUNTER — Encounter: Payer: Self-pay | Admitting: Family Medicine

## 2017-08-12 ENCOUNTER — Encounter: Payer: 59 | Admitting: Physical Medicine & Rehabilitation

## 2017-08-17 ENCOUNTER — Encounter: Payer: 59 | Admitting: Physical Medicine & Rehabilitation

## 2017-08-17 ENCOUNTER — Encounter: Payer: Self-pay | Admitting: Physical Medicine & Rehabilitation

## 2017-08-17 VITALS — BP 122/80 | HR 58

## 2017-08-17 DIAGNOSIS — G8929 Other chronic pain: Secondary | ICD-10-CM | POA: Diagnosis not present

## 2017-08-17 DIAGNOSIS — M791 Myalgia, unspecified site: Secondary | ICD-10-CM | POA: Diagnosis not present

## 2017-08-17 NOTE — Progress Notes (Signed)
Trigger point injection procedure note: Trigger Point Injection: Written consent was obtained for the patient. Trigger points were identified of left lumbosacral PSPs and gluteal muscles (x3). The areas were cleaned with alcohol, vapocoolant spray applied, needle draw back was performed, and each of  these trigger points were injected with 1 cc of 0.5% Marcaine. There were no complications from the procedure, and it was well tolerated.

## 2017-08-31 ENCOUNTER — Encounter: Payer: Self-pay | Admitting: Physical Medicine & Rehabilitation

## 2017-08-31 ENCOUNTER — Encounter: Payer: 59 | Admitting: Physical Medicine & Rehabilitation

## 2017-08-31 VITALS — BP 110/75 | HR 53

## 2017-08-31 DIAGNOSIS — M791 Myalgia, unspecified site: Secondary | ICD-10-CM

## 2017-08-31 DIAGNOSIS — M2170 Unequal limb length (acquired), unspecified site: Secondary | ICD-10-CM | POA: Diagnosis not present

## 2017-08-31 DIAGNOSIS — M5417 Radiculopathy, lumbosacral region: Secondary | ICD-10-CM

## 2017-08-31 DIAGNOSIS — G8929 Other chronic pain: Secondary | ICD-10-CM

## 2017-08-31 DIAGNOSIS — M5442 Lumbago with sciatica, left side: Secondary | ICD-10-CM | POA: Diagnosis not present

## 2017-08-31 MED ORDER — IBUPROFEN 600 MG PO TABS: 600.0000 mg | ORAL_TABLET | Freq: Every day | ORAL | 2 refills | Status: DC | PRN

## 2017-08-31 MED ORDER — BACLOFEN 10 MG PO TABS: 20.0000 mg | ORAL_TABLET | Freq: Three times a day (TID) | ORAL | 1 refills | Status: DC | PRN

## 2017-08-31 MED ORDER — DULOXETINE HCL 30 MG PO CPEP: 30.0000 mg | ORAL_CAPSULE | Freq: Every day | ORAL | 1 refills | Status: DC

## 2017-08-31 MED ORDER — GABAPENTIN 300 MG PO CAPS: 900.0000 mg | ORAL_CAPSULE | Freq: Three times a day (TID) | ORAL | 1 refills | Status: DC
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
## 2017-09-09 ENCOUNTER — Encounter: Payer: 59 | Attending: Physical Medicine & Rehabilitation

## 2017-09-09 ENCOUNTER — Ambulatory Visit: Payer: 59 | Admitting: Physical Medicine & Rehabilitation

## 2017-09-09 ENCOUNTER — Encounter: Payer: Self-pay | Admitting: Physical Medicine & Rehabilitation

## 2017-09-09 VITALS — BP 120/78 | HR 53 | Resp 14

## 2017-09-09 DIAGNOSIS — I4891 Unspecified atrial fibrillation: Secondary | ICD-10-CM | POA: Diagnosis not present

## 2017-09-09 DIAGNOSIS — M5442 Lumbago with sciatica, left side: Secondary | ICD-10-CM | POA: Diagnosis present

## 2017-09-09 DIAGNOSIS — G8929 Other chronic pain: Secondary | ICD-10-CM | POA: Diagnosis not present

## 2017-09-09 DIAGNOSIS — M5417 Radiculopathy, lumbosacral region: Secondary | ICD-10-CM | POA: Diagnosis not present

## 2017-09-09 DIAGNOSIS — E785 Hyperlipidemia, unspecified: Secondary | ICD-10-CM | POA: Diagnosis not present

## 2017-09-09 DIAGNOSIS — M791 Myalgia, unspecified site: Secondary | ICD-10-CM | POA: Insufficient documentation

## 2017-09-09 DIAGNOSIS — Z87891 Personal history of nicotine dependence: Secondary | ICD-10-CM | POA: Insufficient documentation

## 2017-09-09 DIAGNOSIS — Z9889 Other specified postprocedural states: Secondary | ICD-10-CM | POA: Diagnosis not present

## 2017-09-09 DIAGNOSIS — K219 Gastro-esophageal reflux disease without esophagitis: Secondary | ICD-10-CM | POA: Insufficient documentation

## 2017-09-09 NOTE — Patient Instructions (Signed)

## 2017-09-09 NOTE — Progress Notes (Signed)
  PROCEDURE RECORD [facility]   Name: Jayveon Convey DOB:1981-05-03 MRN: [id number]  Date:09/09/2017  Physician: Alysia Penna, MD    Nurse/CMA: Aleane Wesenberg, CMA  Allergies: No Known Allergies  Consent Signed: Yes.    Is patient diabetic? No.  CBG today?   Pregnant: No. LMP: No LMP for male patient. (age 39-55)  Anticoagulants: no Anti-inflammatory: no Antibiotics: no  Procedure:left  transforaminal epidural steroid injecrtion  Position: Prone Start Time:4:05pm   End Time: 4:12pm  Fluoro Time: 25s  RN/CMA Shalea Tomczak, CMA Damione Robideau, CMA    Time 3:45pm 4:18pm    BP 120/78 118/77    Pulse 53 48    Respirations 16 16    O2 Sat 97 96    S/S 6 6    Pain Level 3/10 0/10     D/C home with wife, patient A & O X 3, D/C instructions reviewed, and sits independently.

## 2017-09-16 ENCOUNTER — Telehealth: Payer: Self-pay | Admitting: Physical Medicine & Rehabilitation

## 2017-09-16 NOTE — Telephone Encounter (Signed)
He should try taking it with food.  If he is already doing so, then he can d/c it.   Thanks.

## 2017-09-16 NOTE — Telephone Encounter (Signed)
Patient taking Duloxetine and it is making him very sleepy. Would like to know if he can stop taking it.  Please call patient at [phone].

## 2017-09-19 NOTE — Telephone Encounter (Signed)
Message left on VM per DPR. 

## 2017-10-05 ENCOUNTER — Encounter: Payer: Self-pay | Admitting: Physical Medicine & Rehabilitation

## 2017-10-05 ENCOUNTER — Encounter: Payer: 59 | Attending: Physical Medicine & Rehabilitation | Admitting: Physical Medicine & Rehabilitation

## 2017-10-05 VITALS — BP 113/77 | HR 56 | Resp 14

## 2017-10-05 DIAGNOSIS — M5417 Radiculopathy, lumbosacral region: Secondary | ICD-10-CM

## 2017-10-05 DIAGNOSIS — M2170 Unequal limb length (acquired), unspecified site: Secondary | ICD-10-CM | POA: Diagnosis not present

## 2017-10-05 DIAGNOSIS — M791 Myalgia, unspecified site: Secondary | ICD-10-CM

## 2017-10-05 DIAGNOSIS — G8929 Other chronic pain: Secondary | ICD-10-CM

## 2017-10-05 DIAGNOSIS — M5442 Lumbago with sciatica, left side: Secondary | ICD-10-CM | POA: Diagnosis present

## 2017-10-05 DIAGNOSIS — I4891 Unspecified atrial fibrillation: Secondary | ICD-10-CM | POA: Insufficient documentation

## 2017-10-05 DIAGNOSIS — K219 Gastro-esophageal reflux disease without esophagitis: Secondary | ICD-10-CM | POA: Diagnosis not present

## 2017-10-05 DIAGNOSIS — E785 Hyperlipidemia, unspecified: Secondary | ICD-10-CM | POA: Insufficient documentation

## 2017-10-05 DIAGNOSIS — Z87891 Personal history of nicotine dependence: Secondary | ICD-10-CM | POA: Insufficient documentation

## 2017-10-05 DIAGNOSIS — Z9889 Other specified postprocedural states: Secondary | ICD-10-CM | POA: Insufficient documentation

## 2017-10-05 MED ORDER — DULOXETINE HCL 60 MG PO CPEP: 60.0000 mg | ORAL_CAPSULE | Freq: Every day | ORAL | 1 refills | Status: DC

## 2017-10-05 MED ORDER — GABAPENTIN 600 MG PO TABS: 1200.0000 mg | ORAL_TABLET | Freq: Three times a day (TID) | ORAL | 1 refills | Status: DC

## 2017-10-05 MED ORDER — BACLOFEN 10 MG PO TABS: 20.0000 mg | ORAL_TABLET | Freq: Three times a day (TID) | ORAL | 1 refills | Status: DC | PRN
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
## 2017-10-21 ENCOUNTER — Encounter: Payer: Self-pay | Admitting: Physical Medicine & Rehabilitation

## 2017-10-21 ENCOUNTER — Ambulatory Visit (HOSPITAL_BASED_OUTPATIENT_CLINIC_OR_DEPARTMENT_OTHER): Payer: 59 | Admitting: Physical Medicine & Rehabilitation

## 2017-10-21 VITALS — BP 122/76 | HR 61 | Resp 14

## 2017-10-21 DIAGNOSIS — M5417 Radiculopathy, lumbosacral region: Secondary | ICD-10-CM | POA: Diagnosis not present

## 2017-10-21 DIAGNOSIS — G8929 Other chronic pain: Secondary | ICD-10-CM | POA: Diagnosis not present

## 2017-10-21 NOTE — Progress Notes (Signed)
  PROCEDURE RECORD [facility]   Name: George Singleton DOB:January 22, 1981 MRN: [id number]  Date:10/21/2017  Physician: Alysia Penna, MD    Nurse/CMA: Rucha Wissinger, CMA   Allergies: No Known Allergies  Consent Signed: Yes.    Is patient diabetic? No.  CBG today?   Pregnant: No. LMP: No LMP for male patient. (age 39-55)  Anticoagulants: no Anti-inflammatory: no Antibiotics: no  Procedure: left S1 transforaminal epidural steroid injection  Position: Prone Start Time: 2:05pm  End Time:2:11pm   Fluoro Time: 36  RN/CMA Emerie Vanderkolk, CMA Toiya Morrish, CMA    Time 1:50pm 2:16pm    BP 122/76 120/76    Pulse 61 63    Respirations 14 14    O2 Sat 96 97    S/S 6 6    Pain Level 3/10 1/10     D/C home with wife, patient A & O X 3, D/C instructions reviewed, and sits independently.

## 2017-10-21 NOTE — Progress Notes (Signed)
LEFT S1 Sacral transforaminal epidural steroid injection under fluoroscopic guidance  Indication: Lumbosacral radiculitis is not relieved by medication management or other conservative care and interfering with self-care and mobility.  Prior Left S1 ESI performed 09/09/17 resulted in partial relief of Left radicular pain in Left  LE to foot  Informed consent was obtained after describing risk and benefits of the procedure with the patient, this includes bleeding, bruising, infection, paralysis and medication side effects.  The patient wishes to proceed and has given written consent.  Patient was placed in prone position.  The lumbar area was marked and prepped with Betadine.  It was entered with a 25-gauge 1-1/2 inch needle and one mL of 1% lidocaine was injected into the skin and subcutaneous tissue.  Then a 22-gauge 3.5 inch spinal needle was inserted into the Left S1 sacral foramen under AP, lateral, and oblique view.  Then a solution containing one mL of 10 mg per mL dexamethasone and 2 mL of 1% lidocaine was injected.  The patient tolerated procedure well.  Post procedure instructions were given.  Please see post procedure form.

## 2017-10-21 NOTE — Patient Instructions (Signed)

## 2017-11-02 ENCOUNTER — Encounter: Payer: Self-pay | Admitting: Physical Medicine & Rehabilitation

## 2017-11-02 ENCOUNTER — Encounter: Payer: 59 | Attending: Physical Medicine & Rehabilitation | Admitting: Physical Medicine & Rehabilitation

## 2017-11-02 VITALS — BP 118/79 | HR 66 | Resp 14 | Ht 73.0 in | Wt 185.0 lb

## 2017-11-02 DIAGNOSIS — E785 Hyperlipidemia, unspecified: Secondary | ICD-10-CM | POA: Insufficient documentation

## 2017-11-02 DIAGNOSIS — G8929 Other chronic pain: Secondary | ICD-10-CM | POA: Diagnosis not present

## 2017-11-02 DIAGNOSIS — Z87891 Personal history of nicotine dependence: Secondary | ICD-10-CM | POA: Insufficient documentation

## 2017-11-02 DIAGNOSIS — Z9889 Other specified postprocedural states: Secondary | ICD-10-CM | POA: Insufficient documentation

## 2017-11-02 DIAGNOSIS — I4891 Unspecified atrial fibrillation: Secondary | ICD-10-CM | POA: Diagnosis not present

## 2017-11-02 DIAGNOSIS — K219 Gastro-esophageal reflux disease without esophagitis: Secondary | ICD-10-CM | POA: Diagnosis not present

## 2017-11-02 DIAGNOSIS — M2170 Unequal limb length (acquired), unspecified site: Secondary | ICD-10-CM | POA: Diagnosis not present

## 2017-11-02 DIAGNOSIS — M5417 Radiculopathy, lumbosacral region: Secondary | ICD-10-CM

## 2017-11-02 DIAGNOSIS — M5442 Lumbago with sciatica, left side: Secondary | ICD-10-CM | POA: Insufficient documentation

## 2017-11-02 DIAGNOSIS — M791 Myalgia, unspecified site: Secondary | ICD-10-CM | POA: Insufficient documentation

## 2017-11-02 MED ORDER — PREGABALIN 100 MG PO CAPS
100.0000 mg | ORAL_CAPSULE | Freq: Three times a day (TID) | ORAL | 1 refills | Status: DC
Start: 2017-11-02 — End: 2017-11-30
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
## 2017-11-03 ENCOUNTER — Telehealth: Payer: Self-pay

## 2017-11-03 NOTE — Telephone Encounter (Signed)
Patient called, stated pharmacist told him he will need prior auth for his lyrica.

## 2017-11-04 NOTE — Telephone Encounter (Signed)
Pt  Lyrica has been approved for coverage until 11/05/2018. Pharmacy has been notified.

## 2017-11-30 ENCOUNTER — Encounter: Payer: 59 | Attending: Physical Medicine & Rehabilitation | Admitting: Physical Medicine & Rehabilitation

## 2017-11-30 ENCOUNTER — Other Ambulatory Visit: Payer: Self-pay

## 2017-11-30 ENCOUNTER — Encounter: Payer: Self-pay | Admitting: Physical Medicine & Rehabilitation

## 2017-11-30 VITALS — BP 113/75 | HR 56 | Ht 73.0 in | Wt 200.6 lb

## 2017-11-30 DIAGNOSIS — I4891 Unspecified atrial fibrillation: Secondary | ICD-10-CM | POA: Insufficient documentation

## 2017-11-30 DIAGNOSIS — M5442 Lumbago with sciatica, left side: Secondary | ICD-10-CM | POA: Insufficient documentation

## 2017-11-30 DIAGNOSIS — M5417 Radiculopathy, lumbosacral region: Secondary | ICD-10-CM

## 2017-11-30 DIAGNOSIS — K219 Gastro-esophageal reflux disease without esophagitis: Secondary | ICD-10-CM | POA: Insufficient documentation

## 2017-11-30 DIAGNOSIS — Z9889 Other specified postprocedural states: Secondary | ICD-10-CM | POA: Diagnosis not present

## 2017-11-30 DIAGNOSIS — G8929 Other chronic pain: Secondary | ICD-10-CM | POA: Diagnosis not present

## 2017-11-30 DIAGNOSIS — M2170 Unequal limb length (acquired), unspecified site: Secondary | ICD-10-CM | POA: Diagnosis not present

## 2017-11-30 DIAGNOSIS — Z87891 Personal history of nicotine dependence: Secondary | ICD-10-CM | POA: Insufficient documentation

## 2017-11-30 DIAGNOSIS — M791 Myalgia, unspecified site: Secondary | ICD-10-CM | POA: Diagnosis not present

## 2017-11-30 DIAGNOSIS — E785 Hyperlipidemia, unspecified: Secondary | ICD-10-CM | POA: Insufficient documentation

## 2017-11-30 MED ORDER — BACLOFEN 10 MG PO TABS: 20.0000 mg | ORAL_TABLET | Freq: Three times a day (TID) | ORAL | 1 refills | Status: DC | PRN

## 2017-11-30 MED ORDER — PREGABALIN 200 MG PO CAPS: 200.0000 mg | ORAL_CAPSULE | Freq: Three times a day (TID) | ORAL | 1 refills | Status: DC

## 2017-11-30 NOTE — Addendum Note (Signed)
Addended by: Delice Lesch A on: 11/30/2017 09:56 AM   Modules accepted: Orders
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
## 2017-12-13 ENCOUNTER — Other Ambulatory Visit: Payer: Self-pay | Admitting: Physical Medicine & Rehabilitation

## 2018-01-06 ENCOUNTER — Encounter: Payer: 59 | Attending: Physical Medicine & Rehabilitation | Admitting: Physical Medicine & Rehabilitation

## 2018-01-06 ENCOUNTER — Other Ambulatory Visit: Payer: Self-pay

## 2018-01-06 ENCOUNTER — Encounter: Payer: Self-pay | Admitting: Physical Medicine & Rehabilitation

## 2018-01-06 VITALS — BP 122/77 | HR 55 | Ht 73.0 in | Wt 206.6 lb

## 2018-01-06 DIAGNOSIS — I4891 Unspecified atrial fibrillation: Secondary | ICD-10-CM | POA: Insufficient documentation

## 2018-01-06 DIAGNOSIS — G8929 Other chronic pain: Secondary | ICD-10-CM | POA: Diagnosis not present

## 2018-01-06 DIAGNOSIS — M5442 Lumbago with sciatica, left side: Secondary | ICD-10-CM | POA: Diagnosis present

## 2018-01-06 DIAGNOSIS — E785 Hyperlipidemia, unspecified: Secondary | ICD-10-CM | POA: Diagnosis not present

## 2018-01-06 DIAGNOSIS — M2170 Unequal limb length (acquired), unspecified site: Secondary | ICD-10-CM

## 2018-01-06 DIAGNOSIS — Z9889 Other specified postprocedural states: Secondary | ICD-10-CM | POA: Diagnosis not present

## 2018-01-06 DIAGNOSIS — M5417 Radiculopathy, lumbosacral region: Secondary | ICD-10-CM | POA: Diagnosis not present

## 2018-01-06 DIAGNOSIS — Z87891 Personal history of nicotine dependence: Secondary | ICD-10-CM | POA: Diagnosis not present

## 2018-01-06 DIAGNOSIS — K219 Gastro-esophageal reflux disease without esophagitis: Secondary | ICD-10-CM | POA: Diagnosis not present

## 2018-01-06 DIAGNOSIS — M791 Myalgia, unspecified site: Secondary | ICD-10-CM | POA: Insufficient documentation

## 2018-01-06 MED ORDER — PREGABALIN 100 MG PO CAPS
100.0000 mg | ORAL_CAPSULE | Freq: Three times a day (TID) | ORAL | 1 refills | Status: DC
Start: 2018-01-06 — End: 2018-03-10
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
## 2018-01-11 ENCOUNTER — Telehealth: Payer: Self-pay | Admitting: *Deleted

## 2018-01-11 DIAGNOSIS — I4891 Unspecified atrial fibrillation: Secondary | ICD-10-CM

## 2018-01-11 NOTE — Telephone Encounter (Signed)
Copied from [place] [phone]. Topic: Referral - Request >> Jan 11, 2018 11:35 AM Yvette Rack wrote: Reason for CRM: pt wife George Singleton [phone] calling stating that her husband went to go see Dr Norm Salt cardiologist at [facility] and he told them that her husband need to establish with another Cardiologist in [place] he want to go see Cardiologist Dr Rupert Stacks [phone]

## 2018-01-12 NOTE — Telephone Encounter (Signed)
Referral placed and I called the pts wife and informed her of this.

## 2018-01-12 NOTE — Telephone Encounter (Signed)
Ok to place referral per pt request for A. Fib.

## 2018-01-20 ENCOUNTER — Encounter: Payer: Self-pay | Admitting: Physical Medicine & Rehabilitation

## 2018-01-20 ENCOUNTER — Other Ambulatory Visit: Payer: Self-pay

## 2018-01-20 ENCOUNTER — Ambulatory Visit (HOSPITAL_BASED_OUTPATIENT_CLINIC_OR_DEPARTMENT_OTHER): Payer: 59 | Admitting: Physical Medicine & Rehabilitation

## 2018-01-20 VITALS — BP 124/75 | HR 70 | Ht 73.0 in | Wt 209.6 lb

## 2018-01-20 DIAGNOSIS — G8929 Other chronic pain: Secondary | ICD-10-CM | POA: Diagnosis not present

## 2018-01-20 DIAGNOSIS — M5417 Radiculopathy, lumbosacral region: Secondary | ICD-10-CM

## 2018-01-20 NOTE — Patient Instructions (Signed)

## 2018-01-20 NOTE — Progress Notes (Signed)
Lumbar Left L5 transforaminal epidural steroid injection under fluoroscopic guidance  Indication: Lumbosacral radiculitis is not relieved by medication management or other conservative care and interfering with self-care and mobility.   Informed consent was obtained after describing risk and benefits of the procedure with the patient, this includes bleeding, bruising, infection, paralysis and medication side effects.  The patient wishes to proceed and has given written consent.  Patient was placed in prone position.  The lumbar area was marked and prepped with Betadine.  It was entered with a 25-gauge 1-1/2 inch needle and one mL of 1% lidocaine was injected into the skin and subcutaneous tissue.  Then a 22-gauge 5" spinal needle was inserted into the Left L5-S1 intervertebral foramen under AP, lateral, and oblique view.  Then a solution containing one mL of 10 mg per mL dexamethasone and 2 mL of 1% lidocaine was injected.  The patient tolerated procedure well.  Post procedure instructions were given.  Please see post procedure form.

## 2018-01-20 NOTE — Progress Notes (Signed)
  PROCEDURE RECORD [facility]   Name: George Singleton DOB:October 19, 1980 MRN: [id number]  Date:01/20/2018  Physician: Alysia Penna, MD    Nurse/CMA: Bright CMA  Allergies: No Known Allergies  Consent Signed: Yes.    Is patient diabetic? No.  CBG today? NA  Pregnant: No. LMP: No LMP for male patient. (age 39-55)  Anticoagulants: no Anti-inflammatory: no Antibiotics: no  Procedure: left L5 epidural steroid injection Position: Prone   Start Time: 3:48pm End Time: 3:58pm Fluoro Time: 40s  RN/CMA Samari Gorby RN Bright CMA    Time 3:22 4:02pm    BP 124/75 122/77    Pulse 70 54    Respirations 14 16    O2 Sat 97 97    S/S 6 6    Pain Level 2/10 1/10     D/C home with wife, patient A & O X 3, D/C instructions reviewed, and sits independently.

## 2018-01-31 ENCOUNTER — Ambulatory Visit: Payer: 59 | Admitting: Internal Medicine

## 2018-01-31 ENCOUNTER — Encounter: Payer: Self-pay | Admitting: Internal Medicine

## 2018-01-31 VITALS — BP 120/76 | HR 63 | Ht 73.0 in | Wt 213.8 lb

## 2018-01-31 DIAGNOSIS — I480 Paroxysmal atrial fibrillation: Secondary | ICD-10-CM | POA: Diagnosis not present

## 2018-01-31 NOTE — Patient Instructions (Signed)
Medication Instructions:  Your physician recommends that you continue on your current medications as directed. Please refer to the Current Medication list given to you today.  Labwork: None ordered.  Testing/Procedures: Your physician suggests you have a Calcium Scoring CT preformed. There is a one time fee of $150, insurance does not typically cover this procedure.   Your physician suggests you purchase an [employer].  liveCor  FDA-cleared EKG at your fingertips. - [employer]   Agricultural engineer, [employer]. [email or web address]://[email or web address].[email or web address]   FDA-cleared, clinical grade mobile EKG monitor: Jodelle Red is the most clinically-validated mobile EKG used by the world's leading cardiac care medical professionals.  This may be useful in monitoring palpitations.  We do not have access to have them emailed and reviewed but will be glad to review while in the office.     Follow-Up: Your physician wants you to follow-up in: One Year with Dr Caryl Comes. You will receive a reminder letter in the mail two months in advance. If you don't receive a letter, please call our office to schedule the follow-up appointment.   Any Other Special Instructions Will Be Listed Below (If Applicable).     If you need a refill on your cardiac medications before your next appointment, please call your pharmacy.
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
## 2018-02-06 ENCOUNTER — Other Ambulatory Visit: Payer: Self-pay | Admitting: *Deleted

## 2018-02-06 MED ORDER — ROSUVASTATIN CALCIUM 20 MG PO TABS: 20.0000 mg | ORAL_TABLET | Freq: Every day | ORAL | 0 refills | Status: DC

## 2018-02-06 NOTE — Telephone Encounter (Signed)
Rx done. 

## 2018-02-10 ENCOUNTER — Ambulatory Visit (INDEPENDENT_AMBULATORY_CARE_PROVIDER_SITE_OTHER)
Admission: RE | Admit: 2018-02-10 | Discharge: 2018-02-10 | Disposition: A | Discharge status: Home / Self Care | Payer: Self-pay | Source: Ambulatory Visit | Attending: Internal Medicine | Admitting: Internal Medicine

## 2018-02-10 ENCOUNTER — Encounter: Payer: Self-pay | Admitting: Physical Medicine & Rehabilitation

## 2018-02-10 ENCOUNTER — Encounter: Payer: 59 | Attending: Physical Medicine & Rehabilitation | Admitting: Physical Medicine & Rehabilitation

## 2018-02-10 VITALS — BP 115/76 | HR 61 | Resp 14 | Ht 73.0 in | Wt 213.0 lb

## 2018-02-10 DIAGNOSIS — E785 Hyperlipidemia, unspecified: Secondary | ICD-10-CM | POA: Diagnosis not present

## 2018-02-10 DIAGNOSIS — K219 Gastro-esophageal reflux disease without esophagitis: Secondary | ICD-10-CM | POA: Insufficient documentation

## 2018-02-10 DIAGNOSIS — M25551 Pain in right hip: Secondary | ICD-10-CM

## 2018-02-10 DIAGNOSIS — M5442 Lumbago with sciatica, left side: Secondary | ICD-10-CM | POA: Insufficient documentation

## 2018-02-10 DIAGNOSIS — Z87891 Personal history of nicotine dependence: Secondary | ICD-10-CM | POA: Insufficient documentation

## 2018-02-10 DIAGNOSIS — I480 Paroxysmal atrial fibrillation: Secondary | ICD-10-CM

## 2018-02-10 DIAGNOSIS — I4891 Unspecified atrial fibrillation: Secondary | ICD-10-CM | POA: Diagnosis not present

## 2018-02-10 DIAGNOSIS — G8929 Other chronic pain: Secondary | ICD-10-CM | POA: Diagnosis present

## 2018-02-10 DIAGNOSIS — M5417 Radiculopathy, lumbosacral region: Secondary | ICD-10-CM

## 2018-02-10 DIAGNOSIS — M2170 Unequal limb length (acquired), unspecified site: Secondary | ICD-10-CM

## 2018-02-10 DIAGNOSIS — S336XXD Sprain of sacroiliac joint, subsequent encounter: Secondary | ICD-10-CM

## 2018-02-10 DIAGNOSIS — M791 Myalgia, unspecified site: Secondary | ICD-10-CM | POA: Insufficient documentation

## 2018-02-10 DIAGNOSIS — Z9889 Other specified postprocedural states: Secondary | ICD-10-CM | POA: Diagnosis not present

## 2018-02-10 MED ORDER — IBUPROFEN 600 MG PO TABS: 600.0000 mg | ORAL_TABLET | Freq: Every day | ORAL | 2 refills | Status: DC | PRN
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
## 2018-02-13 ENCOUNTER — Ambulatory Visit (HOSPITAL_COMMUNITY)
Admission: RE | Admit: 2018-02-13 | Discharge: 2018-02-13 | Disposition: A | Discharge status: Home / Self Care | Payer: 59 | Source: Ambulatory Visit | Attending: Physical Medicine & Rehabilitation | Admitting: Physical Medicine & Rehabilitation

## 2018-02-13 ENCOUNTER — Other Ambulatory Visit: Payer: Self-pay | Admitting: Physical Medicine & Rehabilitation

## 2018-02-13 DIAGNOSIS — M25551 Pain in right hip: Secondary | ICD-10-CM | POA: Insufficient documentation

## 2018-03-10 ENCOUNTER — Encounter: Payer: Self-pay | Admitting: Physical Medicine & Rehabilitation

## 2018-03-10 ENCOUNTER — Encounter: Payer: 59 | Attending: Physical Medicine & Rehabilitation | Admitting: Physical Medicine & Rehabilitation

## 2018-03-10 VITALS — BP 116/75 | HR 60 | Ht 73.0 in | Wt 214.0 lb

## 2018-03-10 DIAGNOSIS — G8929 Other chronic pain: Secondary | ICD-10-CM | POA: Diagnosis not present

## 2018-03-10 DIAGNOSIS — M2170 Unequal limb length (acquired), unspecified site: Secondary | ICD-10-CM | POA: Diagnosis not present

## 2018-03-10 DIAGNOSIS — I4891 Unspecified atrial fibrillation: Secondary | ICD-10-CM | POA: Insufficient documentation

## 2018-03-10 DIAGNOSIS — K219 Gastro-esophageal reflux disease without esophagitis: Secondary | ICD-10-CM | POA: Insufficient documentation

## 2018-03-10 DIAGNOSIS — M791 Myalgia, unspecified site: Secondary | ICD-10-CM | POA: Insufficient documentation

## 2018-03-10 DIAGNOSIS — M5442 Lumbago with sciatica, left side: Secondary | ICD-10-CM | POA: Insufficient documentation

## 2018-03-10 DIAGNOSIS — E785 Hyperlipidemia, unspecified: Secondary | ICD-10-CM | POA: Diagnosis not present

## 2018-03-10 DIAGNOSIS — Z87891 Personal history of nicotine dependence: Secondary | ICD-10-CM | POA: Insufficient documentation

## 2018-03-10 DIAGNOSIS — M5417 Radiculopathy, lumbosacral region: Secondary | ICD-10-CM

## 2018-03-10 DIAGNOSIS — Z9889 Other specified postprocedural states: Secondary | ICD-10-CM | POA: Diagnosis not present

## 2018-03-10 DIAGNOSIS — M25551 Pain in right hip: Secondary | ICD-10-CM

## 2018-03-10 DIAGNOSIS — S336XXD Sprain of sacroiliac joint, subsequent encounter: Secondary | ICD-10-CM

## 2018-03-10 MED ORDER — PREGABALIN 100 MG PO CAPS: 100.0000 mg | ORAL_CAPSULE | Freq: Three times a day (TID) | ORAL | 1 refills | Status: DC

## 2018-03-10 MED ORDER — IBUPROFEN 600 MG PO TABS: 600.0000 mg | ORAL_TABLET | Freq: Every day | ORAL | 2 refills | Status: DC | PRN

## 2018-03-10 MED ORDER — BACLOFEN 10 MG PO TABS: 20.0000 mg | ORAL_TABLET | Freq: Three times a day (TID) | ORAL | 1 refills | Status: DC | PRN

## 2018-03-10 NOTE — Addendum Note (Signed)
Addended by: Delice Lesch A on: 03/10/2018 01:46 PM   Modules accepted: Orders
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
## 2018-03-14 ENCOUNTER — Other Ambulatory Visit: Payer: Self-pay

## 2018-03-14 MED ORDER — DULOXETINE HCL 60 MG PO CPEP: 60.0000 mg | ORAL_CAPSULE | Freq: Every day | ORAL | 2 refills | Status: DC

## 2018-03-21 ENCOUNTER — Encounter: Payer: Self-pay | Admitting: Physical Medicine & Rehabilitation

## 2018-03-21 ENCOUNTER — Ambulatory Visit: Payer: 59 | Admitting: Physical Medicine & Rehabilitation

## 2018-03-21 ENCOUNTER — Other Ambulatory Visit: Payer: Self-pay

## 2018-03-21 VITALS — BP 116/76 | HR 60 | Ht 73.0 in | Wt 209.8 lb

## 2018-03-21 DIAGNOSIS — M533 Sacrococcygeal disorders, not elsewhere classified: Secondary | ICD-10-CM

## 2018-03-21 DIAGNOSIS — G8929 Other chronic pain: Secondary | ICD-10-CM | POA: Diagnosis not present

## 2018-03-21 NOTE — Patient Instructions (Signed)
Sacroiliac injection was performed today. A combination of a naming medicine plus a cortisone medicine was injected. The injection was done under x-ray guidance. This procedure has been performed to help reduce low back and buttocks pain as well as potentially hip pain. The duration of this injection is variable lasting from hours to  Months. It may repeated if needed. 

## 2018-03-21 NOTE — Progress Notes (Signed)
  PROCEDURE RECORD [facility]   Name: George Singleton DOB:08-01-1981 MRN: [id number]  Date:03/21/2018  Physician: Alysia Penna, MD    Nurse/CMA: Wessling/CMA  Allergies: No Known Allergies  Consent Signed: Yes.    Is patient diabetic? No.  CBG today?   Pregnant: No. LMP: No LMP for male patient. (age 39-55)  Anticoagulants: no Anti-inflammatory: no Antibiotics: no  Procedure: Left Sacroiliac injection Position: Prone Start Time:  3:08pm    End Time: 3:12pm  Fluoro Time: 18s  RN/CMA Rinnah Peppel,CMA Wessling, CMA    Time 2:37pm     BP 116/76     Pulse 60     Respirations 14     O2 Sat 97     S/S 6     Pain Level 3/10 1/10     D/C home with wife, patient A & O X 3, D/C instructions reviewed, and sits independently.

## 2018-03-21 NOTE — Progress Notes (Signed)

## 2018-03-27 ENCOUNTER — Other Ambulatory Visit: Payer: Self-pay | Admitting: Physical Medicine & Rehabilitation

## 2018-04-06 ENCOUNTER — Other Ambulatory Visit: Payer: Self-pay

## 2018-04-06 ENCOUNTER — Encounter: Payer: Self-pay | Admitting: Physical Medicine & Rehabilitation

## 2018-04-06 ENCOUNTER — Encounter: Payer: 59 | Attending: Physical Medicine & Rehabilitation | Admitting: Physical Medicine & Rehabilitation

## 2018-04-06 VITALS — BP 123/77 | HR 61 | Ht 73.0 in | Wt 209.6 lb

## 2018-04-06 DIAGNOSIS — Z9889 Other specified postprocedural states: Secondary | ICD-10-CM | POA: Diagnosis not present

## 2018-04-06 DIAGNOSIS — M5417 Radiculopathy, lumbosacral region: Secondary | ICD-10-CM

## 2018-04-06 DIAGNOSIS — K219 Gastro-esophageal reflux disease without esophagitis: Secondary | ICD-10-CM | POA: Insufficient documentation

## 2018-04-06 DIAGNOSIS — I4891 Unspecified atrial fibrillation: Secondary | ICD-10-CM | POA: Diagnosis not present

## 2018-04-06 DIAGNOSIS — E785 Hyperlipidemia, unspecified: Secondary | ICD-10-CM | POA: Diagnosis not present

## 2018-04-06 DIAGNOSIS — M533 Sacrococcygeal disorders, not elsewhere classified: Secondary | ICD-10-CM | POA: Diagnosis not present

## 2018-04-06 DIAGNOSIS — M5442 Lumbago with sciatica, left side: Secondary | ICD-10-CM

## 2018-04-06 DIAGNOSIS — M2170 Unequal limb length (acquired), unspecified site: Secondary | ICD-10-CM | POA: Diagnosis not present

## 2018-04-06 DIAGNOSIS — M791 Myalgia, unspecified site: Secondary | ICD-10-CM | POA: Diagnosis not present

## 2018-04-06 DIAGNOSIS — G8929 Other chronic pain: Secondary | ICD-10-CM | POA: Diagnosis not present

## 2018-04-06 DIAGNOSIS — Z87891 Personal history of nicotine dependence: Secondary | ICD-10-CM | POA: Insufficient documentation

## 2018-04-06 MED ORDER — IBUPROFEN 600 MG PO TABS: 600.0000 mg | ORAL_TABLET | Freq: Every day | ORAL | 2 refills | Status: AC | PRN

## 2018-04-06 MED ORDER — DULOXETINE HCL 60 MG PO CPEP: 60.0000 mg | ORAL_CAPSULE | Freq: Every day | ORAL | 2 refills | Status: DC
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
## 2018-04-11 ENCOUNTER — Encounter: Payer: Self-pay | Admitting: Family Medicine

## 2018-04-11 ENCOUNTER — Ambulatory Visit (INDEPENDENT_AMBULATORY_CARE_PROVIDER_SITE_OTHER): Payer: 59 | Admitting: Family Medicine

## 2018-04-11 VITALS — BP 92/60 | HR 59 | Temp 98.2°F | Ht 73.25 in | Wt 208.7 lb

## 2018-04-11 DIAGNOSIS — Z9889 Other specified postprocedural states: Secondary | ICD-10-CM | POA: Diagnosis not present

## 2018-04-11 DIAGNOSIS — Z0000 Encounter for general adult medical examination without abnormal findings: Secondary | ICD-10-CM | POA: Diagnosis not present

## 2018-04-11 DIAGNOSIS — M5442 Lumbago with sciatica, left side: Secondary | ICD-10-CM

## 2018-04-11 DIAGNOSIS — Z8679 Personal history of other diseases of the circulatory system: Secondary | ICD-10-CM

## 2018-04-11 DIAGNOSIS — G8929 Other chronic pain: Secondary | ICD-10-CM

## 2018-04-11 DIAGNOSIS — Z1331 Encounter for screening for depression: Secondary | ICD-10-CM

## 2018-04-11 LAB — CHOLESTEROL, TOTAL: Cholesterol: 180 mg/dL (ref 0–200)

## 2018-04-11 LAB — HDL CHOLESTEROL: HDL: 42.7 mg/dL (ref >39.00)

## 2018-04-11 LAB — HEMOGLOBIN A1C: HEMOGLOBIN A1C: 5.3 % (ref 4.6–6.5)
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
## 2018-04-11 NOTE — Patient Instructions (Addendum)
BEFORE YOU LEAVE: -labs -follow up: yearly for CPE and as needed  We have ordered labs or studies at this visit. It can take up to 1-2 weeks for results and processing. IF results require follow up or explanation, we will call you with instructions. Clinically stable results will be released to your [facility]. If you have not heard from [place] or cannot find your results in [facility] in 2 weeks please contact our office at [phone].  If you are not yet signed up for [facility], please consider signing up.  Hang in there! I hope you feel better soon!  We recommend the following healthy lifestyle for LIFE: 1) Small portions. But, make sure to get regular (at least 3 per day), healthy meals and small healthy snacks if needed.  2) Eat a healthy clean diet.   TRY TO EAT: -at least 5-7 servings of low sugar, colorful, and nutrient rich vegetables per day (not corn, potatoes or bananas.) -berries are the best choice if you wish to eat fruit (only eat small amounts if trying to reduce weight)  -lean meets (fish, white meat of chicken or [place]) -vegan proteins for some meals - beans or tofu, whole grains, nuts and seeds -Replace bad fats with good fats - good fats include: fish, nuts and seeds, canola oil, olive oil -small amounts of low fat or non fat dairy -small amounts of100 % whole grains - check the lables -drink plenty of water  AVOID: -SUGAR, sweets, anything with added sugar, corn syrup or sweeteners - must read labels as even foods advertised as "healthy" often are loaded with sugar -if you must have a sweetener, small amounts of stevia may be best -sweetened beverages and artificially sweetened beverages -simple starches (rice, bread, potatoes, pasta, chips, etc - small amounts of 100% whole grains are ok) -red meat, pork, butter -fried foods, fast food, processed food, excessive dairy, eggs and coconut.  3)Get at least 150 minutes of sweaty aerobic exercise per week.  4)Reduce stress -  consider counseling, meditation and relaxation to balance other aspects of your life.

## 2018-04-21 ENCOUNTER — Encounter: Payer: Self-pay | Admitting: Physical Medicine & Rehabilitation

## 2018-04-21 ENCOUNTER — Ambulatory Visit: Payer: 59 | Admitting: Physical Medicine & Rehabilitation

## 2018-04-21 ENCOUNTER — Other Ambulatory Visit: Payer: Self-pay

## 2018-04-21 VITALS — BP 112/73 | HR 67 | Ht 73.0 in | Wt 205.2 lb

## 2018-04-21 DIAGNOSIS — G8929 Other chronic pain: Secondary | ICD-10-CM | POA: Diagnosis not present

## 2018-04-21 DIAGNOSIS — M5417 Radiculopathy, lumbosacral region: Secondary | ICD-10-CM | POA: Diagnosis not present

## 2018-04-21 NOTE — Progress Notes (Signed)
  PROCEDURE RECORD [facility]   Name: Pattrick Bady DOB:1980-12-09 MRN: [id number]  Date:04/21/2018  Physician: Alysia Penna, MD    Nurse/CMA: Wessling CMA  Allergies: No Known Allergies  Consent Signed: Yes.    Is patient diabetic? No.  CBG today?  Pregnant: No. LMP: No LMP for male patient. (age 39-55)  Anticoagulants: no Anti-inflammatory: no Antibiotics: no  Procedure: Transforaminal Epidural Steroid injection Position: Prone Start Time:4:25pm End Time: 4:30pm  Fluoro Time: 22s  RN/CMA Shumaker RN Wessling CMA    Time 335 4:36pm    BP 112/73 108/70    Pulse 67 67    Respirations 14 14    O2 Sat 97 97    S/S 6 6    Pain Level 3/10 1/10     D/C home with Estill Bamberg, patient A & O X 3, D/C instructions reviewed, and sits independently.

## 2018-04-21 NOTE — Patient Instructions (Signed)

## 2018-04-21 NOTE — Progress Notes (Signed)
Left L5-S1 Lumbar epidural steroid injection under fluoroscopic guidance  Indication: Lumbosacral radiculitis is not relieved by medication management or other conservative care and interfering with self-care and mobility.  No  anticoagulant use.  Informed consent was obtained after describing risk and benefits of the procedure with the patient, this includes bleeding, bruising, infection, paralysis and medication side effects.  The patient wishes to proceed and has given written consent.  Patient was placed in a prone position.  The lumbar area was marked and prepped with Betadine.  It was entered with a 25-gauge 1-1/2 inch needle and one mL of 1% lidocaine was injected into the skin and subcutaneous tissue.  Then a 17-gauge spinal needle was inserted under fluoroscopic guidance into the Left L5-S1 interlaminar space under AP and Lateral imaging.  Once needle tip of approximated the posterior elements, a loss of resistance technique was utilized with lateral imaging.  A positive loss of resistance was obtained and then confirmed by injecting 2 mL's of Isovue 200.  Then a solution containing 1.5 mL's of 6mg /ml Celestone and 1.5 mL's of 1% lidocaine was injected.  The patient tolerated procedure well.  Post procedure instructions were given.  Please see post procedure form.

## 2018-04-29 ENCOUNTER — Other Ambulatory Visit: Payer: Self-pay | Admitting: Physical Medicine & Rehabilitation

## 2018-05-04 ENCOUNTER — Encounter: Payer: 59 | Attending: Physical Medicine & Rehabilitation | Admitting: Physical Medicine & Rehabilitation

## 2018-05-04 ENCOUNTER — Telehealth: Payer: Self-pay

## 2018-05-04 ENCOUNTER — Encounter: Payer: Self-pay | Admitting: Physical Medicine & Rehabilitation

## 2018-05-04 ENCOUNTER — Other Ambulatory Visit: Payer: Self-pay

## 2018-05-04 VITALS — BP 108/74 | HR 62 | Ht 73.0 in | Wt 208.0 lb

## 2018-05-04 DIAGNOSIS — Z87891 Personal history of nicotine dependence: Secondary | ICD-10-CM | POA: Diagnosis not present

## 2018-05-04 DIAGNOSIS — M791 Myalgia, unspecified site: Secondary | ICD-10-CM | POA: Diagnosis not present

## 2018-05-04 DIAGNOSIS — M533 Sacrococcygeal disorders, not elsewhere classified: Secondary | ICD-10-CM

## 2018-05-04 DIAGNOSIS — I4891 Unspecified atrial fibrillation: Secondary | ICD-10-CM | POA: Insufficient documentation

## 2018-05-04 DIAGNOSIS — M5442 Lumbago with sciatica, left side: Secondary | ICD-10-CM | POA: Insufficient documentation

## 2018-05-04 DIAGNOSIS — E785 Hyperlipidemia, unspecified: Secondary | ICD-10-CM | POA: Diagnosis not present

## 2018-05-04 DIAGNOSIS — S336XXD Sprain of sacroiliac joint, subsequent encounter: Secondary | ICD-10-CM

## 2018-05-04 DIAGNOSIS — K219 Gastro-esophageal reflux disease without esophagitis: Secondary | ICD-10-CM | POA: Diagnosis not present

## 2018-05-04 DIAGNOSIS — G8929 Other chronic pain: Secondary | ICD-10-CM | POA: Diagnosis not present

## 2018-05-04 DIAGNOSIS — M2170 Unequal limb length (acquired), unspecified site: Secondary | ICD-10-CM | POA: Diagnosis not present

## 2018-05-04 DIAGNOSIS — M5417 Radiculopathy, lumbosacral region: Secondary | ICD-10-CM

## 2018-05-04 DIAGNOSIS — M25551 Pain in right hip: Secondary | ICD-10-CM

## 2018-05-04 DIAGNOSIS — Z9889 Other specified postprocedural states: Secondary | ICD-10-CM | POA: Insufficient documentation

## 2018-05-04 NOTE — Telephone Encounter (Signed)
Patient called, stated a MRI was discussed during his visit today and has asked if it can be scheduled for this month.  According to today's visit note:  MRI reviewed, 10/2016 showing left L5-S1 impringement, no improvement with interventions, gradually getting worse, will consider reimaging
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
## 2018-05-04 NOTE — Telephone Encounter (Signed)
I informed him we would do NCS/EMG first and based on the results may or may not need repeat MRI.  Thanks.

## 2018-05-05 NOTE — Telephone Encounter (Signed)
Pt.notified

## 2018-05-06 ENCOUNTER — Other Ambulatory Visit: Payer: Self-pay | Admitting: Family Medicine

## 2018-05-07 ENCOUNTER — Other Ambulatory Visit: Payer: Self-pay | Admitting: Physical Medicine & Rehabilitation

## 2018-05-26 ENCOUNTER — Encounter (HOSPITAL_BASED_OUTPATIENT_CLINIC_OR_DEPARTMENT_OTHER): Payer: 59 | Admitting: Physical Medicine & Rehabilitation

## 2018-05-26 ENCOUNTER — Encounter: Payer: Self-pay | Admitting: Physical Medicine & Rehabilitation

## 2018-05-26 VITALS — BP 122/78 | HR 62 | Ht 73.0 in | Wt 209.4 lb

## 2018-05-26 DIAGNOSIS — M5417 Radiculopathy, lumbosacral region: Secondary | ICD-10-CM

## 2018-05-26 DIAGNOSIS — G8929 Other chronic pain: Secondary | ICD-10-CM | POA: Diagnosis not present

## 2018-05-26 NOTE — Progress Notes (Signed)
Please see media tab for full results 

## 2018-05-27 ENCOUNTER — Other Ambulatory Visit: Payer: Self-pay | Admitting: Family Medicine

## 2018-06-08 DIAGNOSIS — M5430 Sciatica, unspecified side: Secondary | ICD-10-CM | POA: Diagnosis not present

## 2018-06-08 DIAGNOSIS — Z5181 Encounter for therapeutic drug level monitoring: Secondary | ICD-10-CM | POA: Diagnosis not present

## 2018-06-14 DIAGNOSIS — Z113 Encounter for screening for infections with a predominantly sexual mode of transmission: Secondary | ICD-10-CM | POA: Diagnosis not present

## 2018-06-14 DIAGNOSIS — Z13220 Encounter for screening for lipoid disorders: Secondary | ICD-10-CM | POA: Diagnosis not present

## 2018-06-16 ENCOUNTER — Encounter: Payer: Self-pay | Admitting: Physical Medicine & Rehabilitation

## 2018-06-16 ENCOUNTER — Encounter
Payer: BLUE CROSS/BLUE SHIELD | Attending: Physical Medicine & Rehabilitation | Admitting: Physical Medicine & Rehabilitation

## 2018-06-16 ENCOUNTER — Other Ambulatory Visit: Payer: Self-pay

## 2018-06-16 VITALS — BP 108/70 | HR 97 | Ht 73.0 in | Wt 214.0 lb

## 2018-06-16 DIAGNOSIS — K219 Gastro-esophageal reflux disease without esophagitis: Secondary | ICD-10-CM | POA: Insufficient documentation

## 2018-06-16 DIAGNOSIS — M791 Myalgia, unspecified site: Secondary | ICD-10-CM

## 2018-06-16 DIAGNOSIS — M533 Sacrococcygeal disorders, not elsewhere classified: Secondary | ICD-10-CM | POA: Diagnosis not present

## 2018-06-16 DIAGNOSIS — S336XXD Sprain of sacroiliac joint, subsequent encounter: Secondary | ICD-10-CM

## 2018-06-16 DIAGNOSIS — M2170 Unequal limb length (acquired), unspecified site: Secondary | ICD-10-CM

## 2018-06-16 DIAGNOSIS — M5442 Lumbago with sciatica, left side: Secondary | ICD-10-CM | POA: Diagnosis not present

## 2018-06-16 DIAGNOSIS — I4891 Unspecified atrial fibrillation: Secondary | ICD-10-CM | POA: Diagnosis not present

## 2018-06-16 DIAGNOSIS — E785 Hyperlipidemia, unspecified: Secondary | ICD-10-CM | POA: Insufficient documentation

## 2018-06-16 DIAGNOSIS — M5417 Radiculopathy, lumbosacral region: Secondary | ICD-10-CM

## 2018-06-16 DIAGNOSIS — M25551 Pain in right hip: Secondary | ICD-10-CM

## 2018-06-16 DIAGNOSIS — Z9889 Other specified postprocedural states: Secondary | ICD-10-CM | POA: Diagnosis not present

## 2018-06-16 DIAGNOSIS — G8929 Other chronic pain: Secondary | ICD-10-CM

## 2018-06-16 DIAGNOSIS — Z87891 Personal history of nicotine dependence: Secondary | ICD-10-CM | POA: Diagnosis not present

## 2018-06-16 MED ORDER — AMITRIPTYLINE HCL 10 MG PO TABS: 10.0000 mg | ORAL_TABLET | Freq: Every day | ORAL | 1 refills | Status: DC
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
## 2018-07-01 ENCOUNTER — Other Ambulatory Visit: Payer: Self-pay | Admitting: Physical Medicine & Rehabilitation

## 2018-07-05 ENCOUNTER — Telehealth: Payer: Self-pay | Admitting: Physical Medicine & Rehabilitation

## 2018-07-05 NOTE — Telephone Encounter (Signed)
Appeal letter for MRI

## 2018-07-07 ENCOUNTER — Telehealth: Payer: Self-pay

## 2018-07-07 NOTE — Telephone Encounter (Signed)
Pt called to check on the status of MRI approval. I called pt let him know that we have not heard anything yet but Dr. Posey Pronto did write an appeal letter.

## 2018-07-10 ENCOUNTER — Other Ambulatory Visit: Payer: Self-pay | Admitting: Physical Medicine & Rehabilitation

## 2018-07-17 ENCOUNTER — Ambulatory Visit (HOSPITAL_COMMUNITY)
Admission: RE | Admit: 2018-07-17 | Discharge: 2018-07-17 | Disposition: A | Discharge status: Home / Self Care | Payer: BLUE CROSS/BLUE SHIELD | Source: Ambulatory Visit | Attending: Physical Medicine & Rehabilitation | Admitting: Physical Medicine & Rehabilitation

## 2018-07-17 DIAGNOSIS — M5442 Lumbago with sciatica, left side: Secondary | ICD-10-CM | POA: Diagnosis not present

## 2018-07-17 DIAGNOSIS — M5417 Radiculopathy, lumbosacral region: Secondary | ICD-10-CM | POA: Diagnosis not present

## 2018-07-17 DIAGNOSIS — M5126 Other intervertebral disc displacement, lumbar region: Secondary | ICD-10-CM | POA: Insufficient documentation

## 2018-07-17 DIAGNOSIS — G8929 Other chronic pain: Secondary | ICD-10-CM | POA: Insufficient documentation

## 2018-07-17 DIAGNOSIS — M5116 Intervertebral disc disorders with radiculopathy, lumbar region: Secondary | ICD-10-CM | POA: Diagnosis not present

## 2018-07-17 MED ORDER — GADOBUTROL 1 MMOL/ML IV SOLN
10.0000 mL | Freq: Once | INTRAVENOUS | Status: AC | PRN
  Administered 2018-07-17: 10 mL via INTRAVENOUS

## 2018-07-19 ENCOUNTER — Encounter: Payer: Self-pay | Admitting: Physical Medicine & Rehabilitation

## 2018-07-19 ENCOUNTER — Encounter
Payer: BLUE CROSS/BLUE SHIELD | Attending: Physical Medicine & Rehabilitation | Admitting: Physical Medicine & Rehabilitation

## 2018-07-19 ENCOUNTER — Other Ambulatory Visit: Payer: Self-pay

## 2018-07-19 VITALS — BP 115/82 | HR 70 | Ht 73.0 in | Wt 225.2 lb

## 2018-07-19 DIAGNOSIS — G8929 Other chronic pain: Secondary | ICD-10-CM

## 2018-07-19 DIAGNOSIS — M5442 Lumbago with sciatica, left side: Secondary | ICD-10-CM | POA: Insufficient documentation

## 2018-07-19 DIAGNOSIS — M2170 Unequal limb length (acquired), unspecified site: Secondary | ICD-10-CM

## 2018-07-19 DIAGNOSIS — Z9889 Other specified postprocedural states: Secondary | ICD-10-CM | POA: Diagnosis not present

## 2018-07-19 DIAGNOSIS — K219 Gastro-esophageal reflux disease without esophagitis: Secondary | ICD-10-CM | POA: Insufficient documentation

## 2018-07-19 DIAGNOSIS — M791 Myalgia, unspecified site: Secondary | ICD-10-CM

## 2018-07-19 DIAGNOSIS — M533 Sacrococcygeal disorders, not elsewhere classified: Secondary | ICD-10-CM | POA: Diagnosis not present

## 2018-07-19 DIAGNOSIS — E785 Hyperlipidemia, unspecified: Secondary | ICD-10-CM | POA: Insufficient documentation

## 2018-07-19 DIAGNOSIS — Z87891 Personal history of nicotine dependence: Secondary | ICD-10-CM | POA: Diagnosis not present

## 2018-07-19 DIAGNOSIS — M5417 Radiculopathy, lumbosacral region: Secondary | ICD-10-CM | POA: Diagnosis not present

## 2018-07-19 DIAGNOSIS — I4891 Unspecified atrial fibrillation: Secondary | ICD-10-CM | POA: Diagnosis not present
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
## 2018-08-09 ENCOUNTER — Telehealth: Payer: Self-pay | Admitting: Physical Medicine & Rehabilitation

## 2018-08-25 ENCOUNTER — Encounter
Payer: BLUE CROSS/BLUE SHIELD | Attending: Physical Medicine & Rehabilitation | Admitting: Physical Medicine & Rehabilitation

## 2018-08-25 ENCOUNTER — Encounter: Payer: Self-pay | Admitting: Physical Medicine & Rehabilitation

## 2018-08-25 VITALS — BP 120/70 | HR 62 | Resp 14 | Ht 73.0 in | Wt 214.0 lb

## 2018-08-25 DIAGNOSIS — Z9889 Other specified postprocedural states: Secondary | ICD-10-CM | POA: Diagnosis not present

## 2018-08-25 DIAGNOSIS — E785 Hyperlipidemia, unspecified: Secondary | ICD-10-CM | POA: Insufficient documentation

## 2018-08-25 DIAGNOSIS — G8929 Other chronic pain: Secondary | ICD-10-CM

## 2018-08-25 DIAGNOSIS — M533 Sacrococcygeal disorders, not elsewhere classified: Secondary | ICD-10-CM | POA: Diagnosis not present

## 2018-08-25 DIAGNOSIS — M2170 Unequal limb length (acquired), unspecified site: Secondary | ICD-10-CM | POA: Diagnosis not present

## 2018-08-25 DIAGNOSIS — M791 Myalgia, unspecified site: Secondary | ICD-10-CM

## 2018-08-25 DIAGNOSIS — K219 Gastro-esophageal reflux disease without esophagitis: Secondary | ICD-10-CM | POA: Insufficient documentation

## 2018-08-25 DIAGNOSIS — Z87891 Personal history of nicotine dependence: Secondary | ICD-10-CM | POA: Insufficient documentation

## 2018-08-25 DIAGNOSIS — I4891 Unspecified atrial fibrillation: Secondary | ICD-10-CM | POA: Diagnosis not present

## 2018-08-25 DIAGNOSIS — M5442 Lumbago with sciatica, left side: Secondary | ICD-10-CM | POA: Insufficient documentation

## 2018-08-25 DIAGNOSIS — M5417 Radiculopathy, lumbosacral region: Secondary | ICD-10-CM | POA: Diagnosis not present

## 2018-08-25 DIAGNOSIS — M25551 Pain in right hip: Secondary | ICD-10-CM

## 2018-08-25 NOTE — Patient Instructions (Signed)
[email or web address]
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
## 2018-08-30 ENCOUNTER — Ambulatory Visit: Payer: BLUE CROSS/BLUE SHIELD | Admitting: Physical Medicine & Rehabilitation

## 2018-09-25 ENCOUNTER — Other Ambulatory Visit: Payer: Self-pay | Admitting: Physical Medicine & Rehabilitation

## 2018-09-26 MED ORDER — PREGABALIN 100 MG PO CAPS: 100.0000 mg | ORAL_CAPSULE | Freq: Two times a day (BID) | ORAL | 1 refills | Status: DC

## 2018-09-26 NOTE — Telephone Encounter (Signed)
Lyrica refilled with sig changed per last visit note to bid with breakfast and lunch.

## 2018-10-06 ENCOUNTER — Encounter: Payer: Self-pay | Admitting: Physical Medicine & Rehabilitation

## 2018-10-06 ENCOUNTER — Encounter
Payer: BLUE CROSS/BLUE SHIELD | Attending: Physical Medicine & Rehabilitation | Admitting: Physical Medicine & Rehabilitation

## 2018-10-06 VITALS — BP 116/81 | HR 69 | Ht 74.0 in | Wt 211.0 lb

## 2018-10-06 DIAGNOSIS — M533 Sacrococcygeal disorders, not elsewhere classified: Secondary | ICD-10-CM | POA: Diagnosis not present

## 2018-10-06 DIAGNOSIS — E785 Hyperlipidemia, unspecified: Secondary | ICD-10-CM | POA: Insufficient documentation

## 2018-10-06 DIAGNOSIS — M2170 Unequal limb length (acquired), unspecified site: Secondary | ICD-10-CM | POA: Diagnosis not present

## 2018-10-06 DIAGNOSIS — M5417 Radiculopathy, lumbosacral region: Secondary | ICD-10-CM | POA: Diagnosis not present

## 2018-10-06 DIAGNOSIS — Z9889 Other specified postprocedural states: Secondary | ICD-10-CM | POA: Insufficient documentation

## 2018-10-06 DIAGNOSIS — Z87891 Personal history of nicotine dependence: Secondary | ICD-10-CM | POA: Insufficient documentation

## 2018-10-06 DIAGNOSIS — I4891 Unspecified atrial fibrillation: Secondary | ICD-10-CM | POA: Diagnosis not present

## 2018-10-06 DIAGNOSIS — G8929 Other chronic pain: Secondary | ICD-10-CM | POA: Insufficient documentation

## 2018-10-06 DIAGNOSIS — M5442 Lumbago with sciatica, left side: Secondary | ICD-10-CM | POA: Insufficient documentation

## 2018-10-06 DIAGNOSIS — M791 Myalgia, unspecified site: Secondary | ICD-10-CM | POA: Diagnosis not present

## 2018-10-06 DIAGNOSIS — S336XXD Sprain of sacroiliac joint, subsequent encounter: Secondary | ICD-10-CM

## 2018-10-06 DIAGNOSIS — K219 Gastro-esophageal reflux disease without esophagitis: Secondary | ICD-10-CM | POA: Diagnosis not present

## 2018-10-06 DIAGNOSIS — M25551 Pain in right hip: Secondary | ICD-10-CM

## 2018-10-06 MED ORDER — AMITRIPTYLINE HCL 10 MG PO TABS: ORAL_TABLET | ORAL | 1 refills | Status: DC
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
## 2018-10-24 ENCOUNTER — Other Ambulatory Visit: Payer: Self-pay | Admitting: Physical Medicine & Rehabilitation

## 2018-11-19 ENCOUNTER — Other Ambulatory Visit: Payer: Self-pay | Admitting: Physical Medicine & Rehabilitation

## 2018-11-20 ENCOUNTER — Other Ambulatory Visit: Payer: Self-pay | Admitting: *Deleted

## 2018-11-20 MED ORDER — PREGABALIN 100 MG PO CAPS: ORAL_CAPSULE | ORAL | 1 refills | Status: DC

## 2018-11-24 ENCOUNTER — Ambulatory Visit (INDEPENDENT_AMBULATORY_CARE_PROVIDER_SITE_OTHER): Payer: BLUE CROSS/BLUE SHIELD | Admitting: Family Medicine

## 2018-11-24 ENCOUNTER — Other Ambulatory Visit: Payer: Self-pay

## 2018-11-24 ENCOUNTER — Encounter: Payer: Self-pay | Admitting: Family Medicine

## 2018-11-24 DIAGNOSIS — K219 Gastro-esophageal reflux disease without esophagitis: Secondary | ICD-10-CM | POA: Diagnosis not present

## 2018-11-24 DIAGNOSIS — M25551 Pain in right hip: Secondary | ICD-10-CM

## 2018-11-24 DIAGNOSIS — G8929 Other chronic pain: Secondary | ICD-10-CM

## 2018-11-24 DIAGNOSIS — J302 Other seasonal allergic rhinitis: Secondary | ICD-10-CM

## 2018-11-24 DIAGNOSIS — M5442 Lumbago with sciatica, left side: Secondary | ICD-10-CM

## 2018-11-24 MED ORDER — FLUTICASONE PROPIONATE 50 MCG/ACT NA SUSP: 2.0000 | Freq: Every day | NASAL | 2 refills | Status: DC

## 2018-11-24 MED ORDER — OMEPRAZOLE 40 MG PO CPDR: 40.0000 mg | DELAYED_RELEASE_CAPSULE | Freq: Every day | ORAL | 2 refills | Status: DC

## 2018-11-24 NOTE — Patient Instructions (Addendum)
It was nice to meet you today! Here are some recommendations from our discussion:  Eliminate acid producing triggers in diet (see below) Omeprazole 30 minutes prior to dinner. Try to avoid eating/drinking 2 hours prior to bedtime.  Continue with zyrtec; add on flonase in the morning. If nose gets dry you can put vasoline with qtip just inside nostrils to help moisturize.   Warm salt water gargles after meals along with tooth and tongue brushing. Use the water irrigator you have to help rinse tonsils.    Gastroesophageal Reflux Disease, Adult Gastroesophageal reflux (GER) happens when acid from the stomach flows up into the tube that connects the mouth and the stomach (esophagus). Normally, food travels down the esophagus and stays in the stomach to be digested. However, when a person has GER, food and stomach acid sometimes move back up into the esophagus. If this becomes a more serious problem, the person may be diagnosed with a disease called gastroesophageal reflux disease (GERD). GERD occurs when the reflux:  Happens often.  Causes frequent or severe symptoms.  Causes problems such as damage to the esophagus. When stomach acid comes in contact with the esophagus, the acid may cause soreness (inflammation) in the esophagus. Over time, GERD may create small holes (ulcers) in the lining of the esophagus. What are the causes? This condition is caused by a problem with the muscle between the esophagus and the stomach (lower esophageal sphincter, or LES). Normally, the LES muscle closes after food passes through the esophagus to the stomach. When the LES is weakened or abnormal, it does not close properly, and that allows food and stomach acid to go back up into the esophagus. The LES can be weakened by certain dietary substances, medicines, and medical conditions, including:  Tobacco use.  Pregnancy.  Having a hiatal hernia.  Alcohol use.  Certain foods and beverages, such as coffee,  chocolate, onions, and peppermint. What increases the risk? You are more likely to develop this condition if you:  Have an increased body weight.  Have a connective tissue disorder.  Use NSAID medicines. What are the signs or symptoms? Symptoms of this condition include:  Heartburn.  Difficult or painful swallowing.  The feeling of having a lump in the throat.  Abitter taste in the mouth.  Bad breath.  Having a large amount of saliva.  Having an upset or bloated stomach.  Belching.  Chest pain. Different conditions can cause chest pain. Make sure you see your health care provider if you experience chest pain.  Shortness of breath or wheezing.  Ongoing (chronic) cough or a night-time cough.  Wearing away of tooth enamel.  Weight loss. How is this diagnosed? Your health care provider will take a medical history and perform a physical exam. To determine if you have mild or severe GERD, your health care provider may also monitor how you respond to treatment. You may also have tests, including:  A test to examine your stomach and esophagus with a small camera (endoscopy).  A test thatmeasures the acidity level in your esophagus.  A test thatmeasures how much pressure is on your esophagus.  A barium swallow or modified barium swallow test to show the shape, size, and functioning of your esophagus. How is this treated? The goal of treatment is to help relieve your symptoms and to prevent complications. Treatment for this condition may vary depending on how severe your symptoms are. Your health care provider may recommend:  Changes to your diet.  Medicine.  Surgery. Follow these instructions at home: Eating and drinking   Follow a diet as recommended by your health care provider. This may involve avoiding foods and drinks such as: ? Coffee and tea (with or without caffeine). ? Drinks that containalcohol. ? Energy drinks and sports drinks. ? Carbonated drinks  or sodas. ? Chocolate and cocoa. ? Peppermint and mint flavorings. ? Garlic and onions. ? Horseradish. ? Spicy and acidic foods, including peppers, chili powder, curry powder, vinegar, hot sauces, and barbecue sauce. ? Citrus fruit juices and citrus fruits, such as oranges, lemons, and limes. ? Tomato-based foods, such as red sauce, chili, salsa, and pizza with red sauce. ? Fried and fatty foods, such as donuts, french fries, potato chips, and high-fat dressings. ? High-fat meats, such as hot dogs and fatty cuts of red and white meats, such as rib eye steak, sausage, ham, and bacon. ? High-fat dairy items, such as whole milk, butter, and cream cheese.  Eat small, frequent meals instead of large meals.  Avoid drinking large amounts of liquid with your meals.  Avoid eating meals during the 2-3 hours before bedtime.  Avoid lying down right after you eat.  Do not exercise right after you eat. Lifestyle   Do not use any products that contain nicotine or tobacco, such as cigarettes, e-cigarettes, and chewing tobacco. If you need help quitting, ask your health care provider.  Try to reduce your stress by using methods such as yoga or meditation. If you need help reducing stress, ask your health care provider.  If you are overweight, reduce your weight to an amount that is healthy for you. Ask your health care provider for guidance about a safe weight loss goal. General instructions  Pay attention to any changes in your symptoms.  Take over-the-counter and prescription medicines only as told by your health care provider. Do not take aspirin, ibuprofen, or other NSAIDs unless your health care provider told you to do so.  Wear loose-fitting clothing. Do not wear anything tight around your waist that causes pressure on your abdomen.  Raise (elevate) the head of your bed about 6 inches (15 cm).  Avoid bending over if this makes your symptoms worse.  Keep all follow-up visits as told by  your health care provider. This is important. Contact a health care provider if:  You have: ? New symptoms. ? Unexplained weight loss. ? Difficulty swallowing or it hurts to swallow. ? Wheezing or a persistent cough. ? A hoarse voice.  Your symptoms do not improve with treatment. Get help right away if you:  Have pain in your arms, neck, jaw, teeth, or back.  Feel sweaty, dizzy, or light-headed.  Have chest pain or shortness of breath.  Vomit and your vomit looks like blood or coffee grounds.  Faint.  Have stool that is bloody or black.  Cannot swallow, drink, or eat. Summary  Gastroesophageal reflux happens when acid from the stomach flows up into the esophagus. GERD is a disease in which the reflux happens often, causes frequent or severe symptoms, or causes problems such as damage to the esophagus.  Treatment for this condition may vary depending on how severe your symptoms are. Your health care provider may recommend diet and lifestyle changes, medicine, or surgery.  Contact a health care provider if you have new or worsening symptoms.  Take over-the-counter and prescription medicines only as told by your health care provider. Do not take aspirin, ibuprofen, or other NSAIDs unless your health care provider told  you to do so.  Keep all follow-up visits as told by your health care provider. This is important. This information is not intended to replace advice given to you by your health care provider. Make sure you discuss any questions you have with your health care provider. Document Released: 04/28/2005 Document Revised: 01/25/2018 Document Reviewed: 01/25/2018 Elsevier Interactive Patient Education  2019 [employer].
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
## 2018-12-06 ENCOUNTER — Encounter
Payer: BLUE CROSS/BLUE SHIELD | Attending: Physical Medicine & Rehabilitation | Admitting: Physical Medicine & Rehabilitation

## 2018-12-06 ENCOUNTER — Other Ambulatory Visit: Payer: Self-pay

## 2018-12-06 ENCOUNTER — Encounter: Payer: Self-pay | Admitting: Physical Medicine & Rehabilitation

## 2018-12-06 VITALS — Ht 74.0 in | Wt 218.0 lb

## 2018-12-06 DIAGNOSIS — E785 Hyperlipidemia, unspecified: Secondary | ICD-10-CM | POA: Insufficient documentation

## 2018-12-06 DIAGNOSIS — Z9889 Other specified postprocedural states: Secondary | ICD-10-CM | POA: Insufficient documentation

## 2018-12-06 DIAGNOSIS — M791 Myalgia, unspecified site: Secondary | ICD-10-CM | POA: Insufficient documentation

## 2018-12-06 DIAGNOSIS — M25551 Pain in right hip: Secondary | ICD-10-CM

## 2018-12-06 DIAGNOSIS — S336XXD Sprain of sacroiliac joint, subsequent encounter: Secondary | ICD-10-CM

## 2018-12-06 DIAGNOSIS — K219 Gastro-esophageal reflux disease without esophagitis: Secondary | ICD-10-CM | POA: Insufficient documentation

## 2018-12-06 DIAGNOSIS — M5417 Radiculopathy, lumbosacral region: Secondary | ICD-10-CM | POA: Diagnosis not present

## 2018-12-06 DIAGNOSIS — I4891 Unspecified atrial fibrillation: Secondary | ICD-10-CM | POA: Insufficient documentation

## 2018-12-06 DIAGNOSIS — M5442 Lumbago with sciatica, left side: Secondary | ICD-10-CM | POA: Insufficient documentation

## 2018-12-06 DIAGNOSIS — Z87891 Personal history of nicotine dependence: Secondary | ICD-10-CM | POA: Insufficient documentation

## 2018-12-06 DIAGNOSIS — G8929 Other chronic pain: Secondary | ICD-10-CM | POA: Insufficient documentation
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
## 2018-12-07 DIAGNOSIS — M25551 Pain in right hip: Secondary | ICD-10-CM | POA: Diagnosis not present

## 2018-12-07 DIAGNOSIS — J309 Allergic rhinitis, unspecified: Secondary | ICD-10-CM | POA: Diagnosis not present

## 2018-12-07 DIAGNOSIS — E785 Hyperlipidemia, unspecified: Secondary | ICD-10-CM | POA: Diagnosis not present

## 2018-12-07 DIAGNOSIS — I4891 Unspecified atrial fibrillation: Secondary | ICD-10-CM | POA: Diagnosis not present

## 2018-12-13 IMAGING — CT CT HEART SCORING
2 series · 16 of 20 positions shown, 18 images · non-contrast
Comparison: None

EXAM:
OVER-READ INTERPRETATION  CT CHEST

The following report is an over-read performed by radiologist Dr.
over-read does not include interpretation of cardiac or coronary
anatomy or pathology. The coronary calcium score interpretation by
the cardiologist is attached.
CLINICAL DATA: Risk stratification
Coronary Calcium Score
MEDICATIONS:
None.
TECHNIQUE: The patient was scanned on a Siemens Force scanner. Axial
non-contrast 3 mm slices were carried out through the heart. The
data set was analyzed on a dedicated work station and scored using
the Agatson method.

[Series 3: casc 3.0 i36f 2 bestdiast 70 % · axial · 0.32mm/px · z∈[+1312,+1418]mm · 8 of 46 slices shown, 10 images]
[im 6/46  vessel]
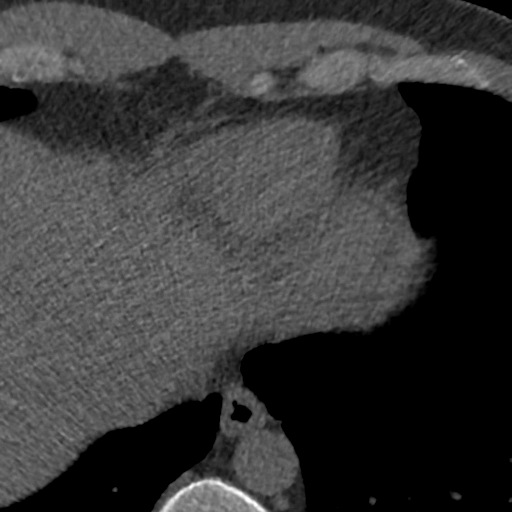
[im 6/46  lung]
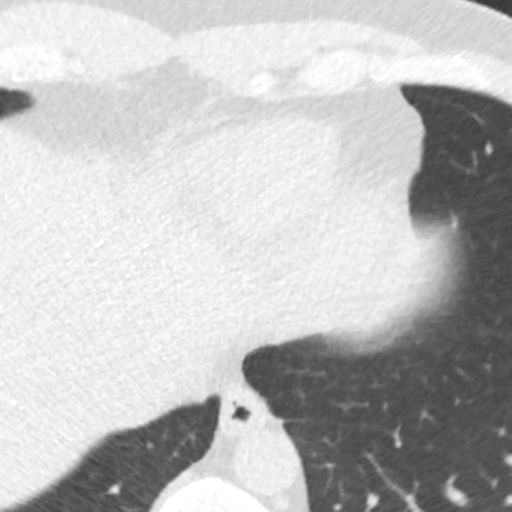
[im 11/46  vessel]
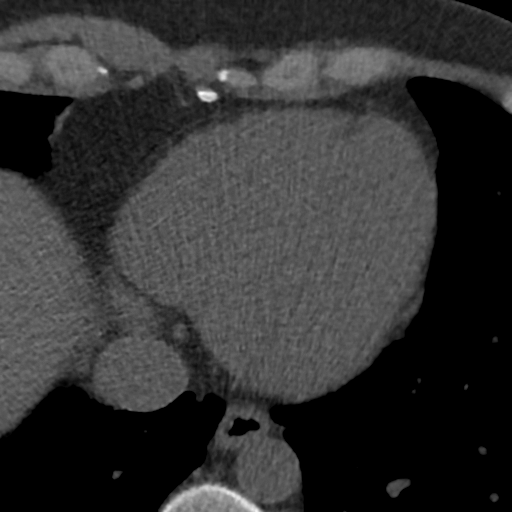
[im 16/46  vessel]
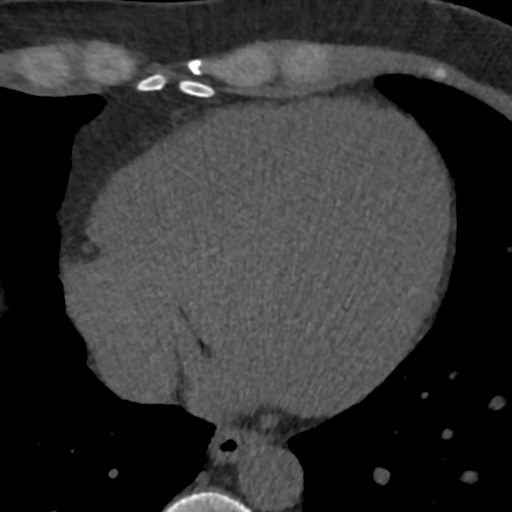
[im 21/46  vessel]
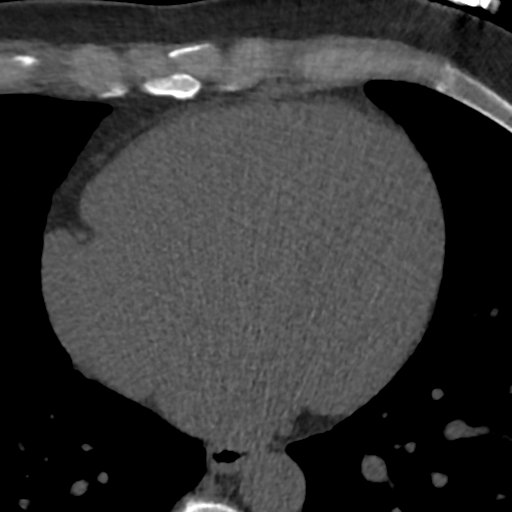
[im 26/46  vessel]
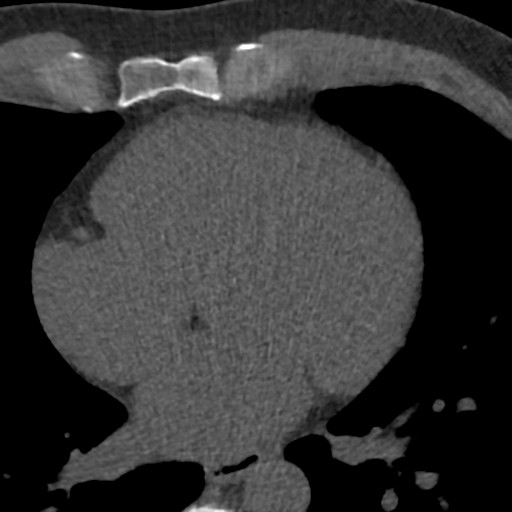
[im 26/46  lung]
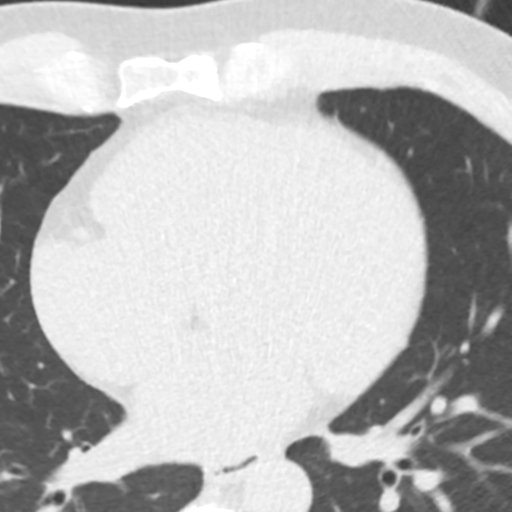
[im 31/46  vessel]
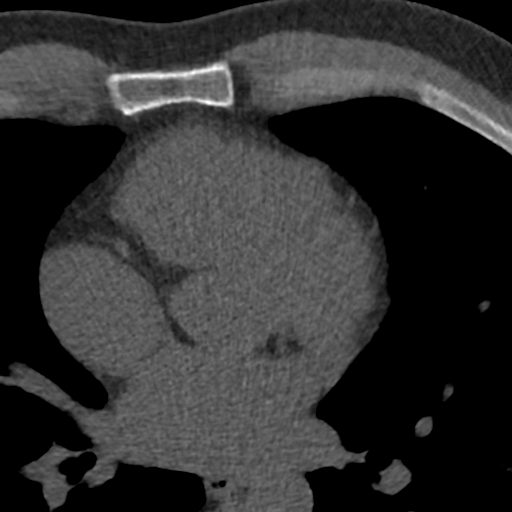
[im 36/46  vessel]
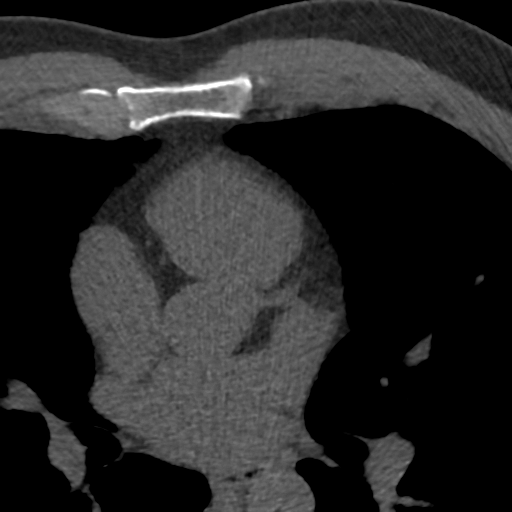
[im 41/46  vessel]
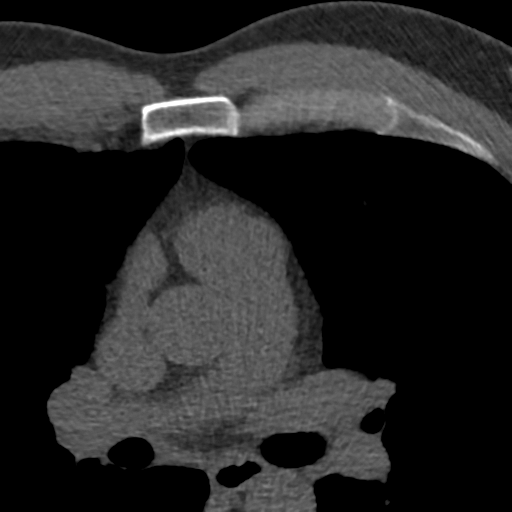

[Series 5: lung st 70 % · axial · 0.68mm/px · z∈[+1308,+1408]mm · 8 of 43 slices shown]
[im 5/43  lung]
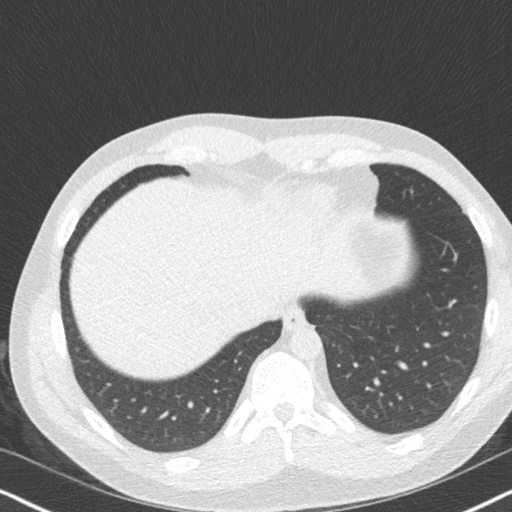
[im 10/43  lung]
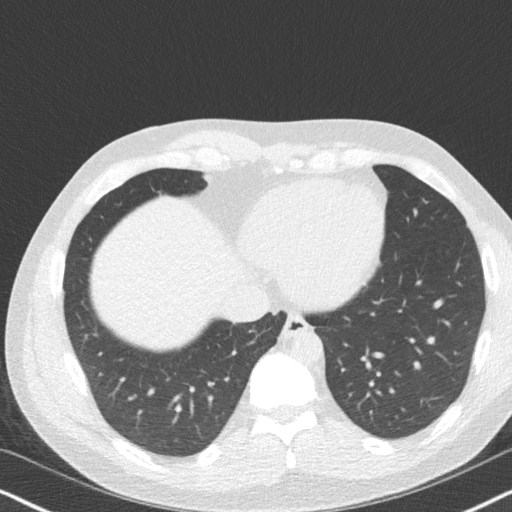
[im 15/43  lung]
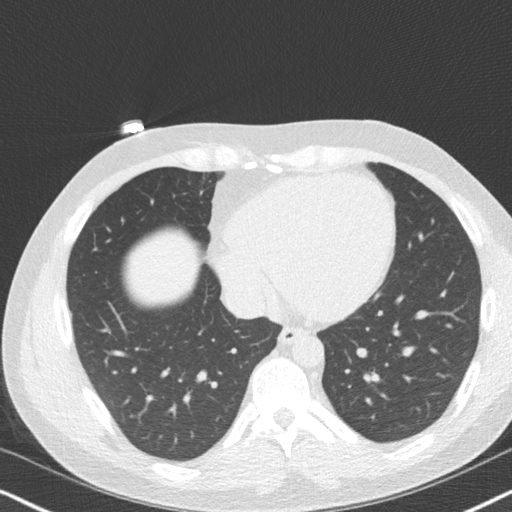
[im 19/43  lung]
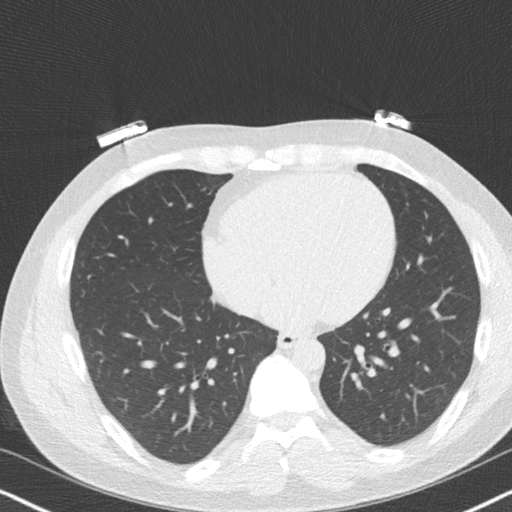
[im 24/43  lung]
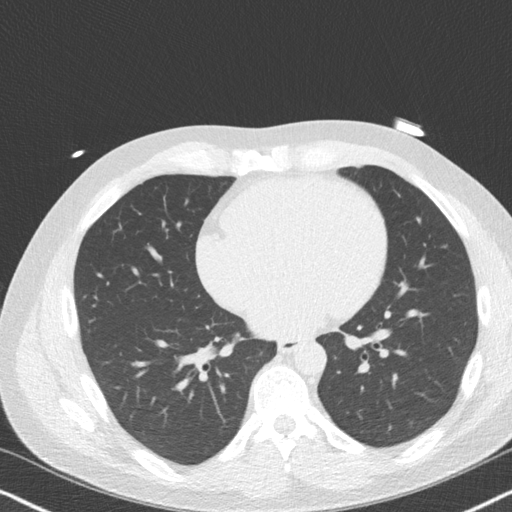
[im 29/43  lung]
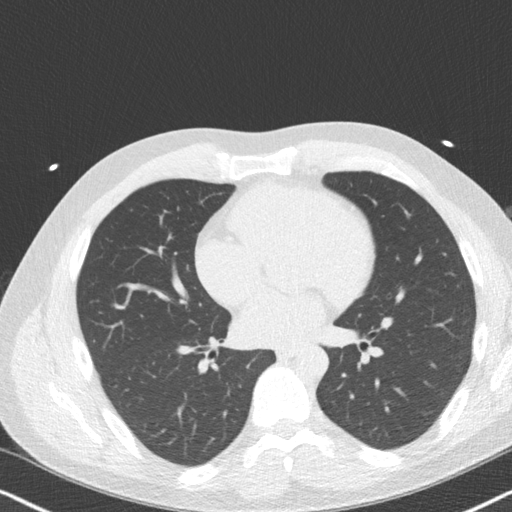
[im 33/43  lung]
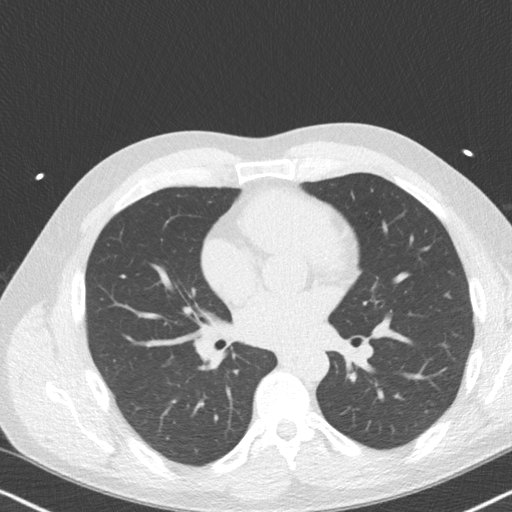
[im 38/43  lung]
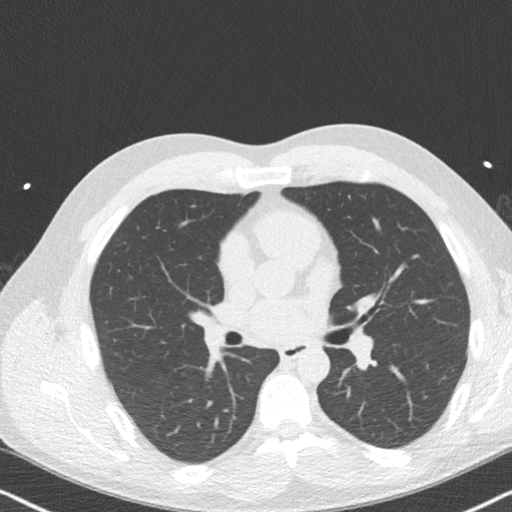

[16 of 20 positions shown; findings below may reference images not displayed]

FINDINGS: Mediastinum is unremarkable.  No adenopathy identified.

No pleural effusion identified bilaterally. No airspace
consolidation or atelectasis. No pulmonary nodule or mass noted.

Visualized portions of the upper abdomen are unremarkable.

No focal bone abnormality.
IMPRESSION: 1. Negative over-read.
FINDINGS: Non-cardiac: See separate report from [REDACTED].

Ascending Aorta: Normal size, no calcifications.

Pericardium: Normal.

Coronary arteries: Normal origin.
IMPRESSION: Coronary calcium score of 0. This was 0 percentile for age and sex
matched control.

## 2019-01-04 ENCOUNTER — Encounter
Payer: BC Managed Care – PPO | Attending: Physical Medicine & Rehabilitation | Admitting: Physical Medicine & Rehabilitation

## 2019-01-04 ENCOUNTER — Other Ambulatory Visit: Payer: Self-pay

## 2019-01-04 ENCOUNTER — Encounter: Payer: Self-pay | Admitting: Physical Medicine & Rehabilitation

## 2019-01-04 VITALS — BP 113/77 | HR 61 | Temp 98.4°F | Ht 74.0 in | Wt 220.0 lb

## 2019-01-04 DIAGNOSIS — K219 Gastro-esophageal reflux disease without esophagitis: Secondary | ICD-10-CM | POA: Diagnosis not present

## 2019-01-04 DIAGNOSIS — Z9889 Other specified postprocedural states: Secondary | ICD-10-CM | POA: Insufficient documentation

## 2019-01-04 DIAGNOSIS — Z87891 Personal history of nicotine dependence: Secondary | ICD-10-CM | POA: Diagnosis not present

## 2019-01-04 DIAGNOSIS — M25551 Pain in right hip: Secondary | ICD-10-CM

## 2019-01-04 DIAGNOSIS — I4891 Unspecified atrial fibrillation: Secondary | ICD-10-CM | POA: Insufficient documentation

## 2019-01-04 DIAGNOSIS — G8929 Other chronic pain: Secondary | ICD-10-CM | POA: Diagnosis not present

## 2019-01-04 DIAGNOSIS — M533 Sacrococcygeal disorders, not elsewhere classified: Secondary | ICD-10-CM

## 2019-01-04 DIAGNOSIS — E785 Hyperlipidemia, unspecified: Secondary | ICD-10-CM | POA: Diagnosis not present

## 2019-01-04 DIAGNOSIS — M791 Myalgia, unspecified site: Secondary | ICD-10-CM | POA: Diagnosis not present

## 2019-01-04 DIAGNOSIS — M5417 Radiculopathy, lumbosacral region: Secondary | ICD-10-CM

## 2019-01-04 DIAGNOSIS — M5442 Lumbago with sciatica, left side: Secondary | ICD-10-CM | POA: Diagnosis not present

## 2019-01-04 DIAGNOSIS — M2170 Unequal limb length (acquired), unspecified site: Secondary | ICD-10-CM

## 2019-01-04 DIAGNOSIS — S336XXD Sprain of sacroiliac joint, subsequent encounter: Secondary | ICD-10-CM | POA: Diagnosis not present

## 2019-01-04 MED ORDER — PREGABALIN 100 MG PO CAPS: 100.0000 mg | ORAL_CAPSULE | Freq: Every day | ORAL | 1 refills | Status: DC

## 2019-01-04 MED ORDER — PREGABALIN 50 MG PO CAPS: 50.0000 mg | ORAL_CAPSULE | Freq: Every day | ORAL | 1 refills | Status: DC
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
## 2019-01-04 NOTE — Addendum Note (Signed)
Addended by: Delice Lesch A on: 01/04/2019 12:02 PM   Modules accepted: Orders

## 2019-01-15 ENCOUNTER — Telehealth: Payer: Self-pay | Admitting: *Deleted

## 2019-01-15 MED ORDER — PREGABALIN 50 MG PO CAPS: ORAL_CAPSULE | ORAL | 1 refills | Status: DC

## 2019-01-15 NOTE — Telephone Encounter (Signed)
Per Dr Posey Pronto, he had been decreased to 100 mg daily and was unable to tolerate so he increased him to 100 mg in am and 50 mg in afternoon.  Will send new order to pharmacy for 50 mg capsules, two in am and one in the afternoon. #90 w 1rf

## 2019-01-15 NOTE — Telephone Encounter (Signed)
Mr Mcelhannon is having an issue with his refills on his lyrica.  I spoke with the pharmacy and they have the lyrica 100 mg bid  But our orders say lyrica 100 mg daily and 50 mg daily. Your note does not make clear what his dose is to be.  Did you mean he is unable to tolerate the increase in Lyrica? Can you clarify please?

## 2019-01-18 ENCOUNTER — Telehealth: Payer: Self-pay

## 2019-01-18 NOTE — Telephone Encounter (Signed)
I called and left patient a message about switching office visit to telehealth visit on 01/30/19.

## 2019-01-23 NOTE — Telephone Encounter (Signed)
Patient called back, he would rather come into the office for an office visit and not do a virtual visit. Please call to schedule.

## 2019-01-23 NOTE — Telephone Encounter (Signed)
I called and left patient a message about upcoming appointment on 01/30/19. We need consent to bill insurance.

## 2019-01-26 ENCOUNTER — Encounter
Payer: BC Managed Care – PPO | Attending: Physical Medicine & Rehabilitation | Admitting: Physical Medicine & Rehabilitation

## 2019-01-26 ENCOUNTER — Other Ambulatory Visit: Payer: Self-pay

## 2019-01-26 VITALS — BP 120/81 | HR 80 | Temp 97.7°F | Ht 74.0 in | Wt 226.0 lb

## 2019-01-26 DIAGNOSIS — M47817 Spondylosis without myelopathy or radiculopathy, lumbosacral region: Secondary | ICD-10-CM | POA: Insufficient documentation

## 2019-01-26 NOTE — Progress Notes (Signed)
Bilateral Lumbar L2,, L3  medial branch blocks (to block the L3-4 facet joint) under fluoroscopic guidance  Indication: Lumbar pain which is not relieved by medication management or other conservative care and interfering with self-care and mobility.  Informed consent was obtained after describing risks and benefits of the procedure with the patient, this includes bleeding, infection, paralysis and medication side effects.  The patient wishes to proceed and has given written consent.  The patient was placed in prone position.  The lumbar area was marked and prepped with Betadine.  One mL of 1% lidocaine was injected into each of 4areas into the skin and subcutaneous tissue.  Then a 22-gauge 3.5 inch spinal needle was inserted targeting the left L4 superior articular process /transverse process junction   Bone contact was made.  Isovue 200 was injected x 0.5 mL demonstrating no intravascular uptake.  Then a solution containing2% MPF lidocaine was injected x 0.5 mL. Then a 22-gauge 3.5 inch spinal needle was inserted targeting the left L3 superior articular process / transverse process junction   Bone contact was made.  Isovue 200 was injected x 0.5 mL demonstrating no intravascular uptake.  Then a solution containing2% MPF lidocaine was injected x 0.5 mL.  This same procedure was performed on the right side using the same needle, technique and injectate.  Patient tolerated procedure well.  Post procedure instructions were given.

## 2019-01-26 NOTE — Patient Instructions (Signed)

## 2019-01-26 NOTE — Progress Notes (Signed)
  PROCEDURE RECORD [facility]   Name: George Singleton DOB:1980-08-24 MRN: [id number]  Date:01/26/2019  Physician: Alysia Penna, MD    Nurse/CMA: Truman Hayward, CMA  Allergies: No Known Allergies  Consent Signed: Yes.    Is patient diabetic? No.  CBG today?   Pregnant: No. LMP: No LMP for male patient. (age 40-55)  Anticoagulants: no Anti-inflammatory: no Antibiotics: no  Procedure: Lumbar Facet Joint L3-4 Position: Prone Start Time: 2:45pm End Time: 2:53pm Fluoro Time: 66  RN/CMA Truman Hayward, CMA Jaimi Belle, CMA    Time 2:27pm 2:58pm    BP 120/81 131/83    Pulse 80 80    Respirations 16 16    O2 Sat 96 96    S/S 6/6 6/6    Pain Level 4/10 2/10     D/C home with no one, patient A & O X 3, D/C instructions reviewed, and sits independently.

## 2019-01-28 ENCOUNTER — Other Ambulatory Visit: Payer: Self-pay | Admitting: Family Medicine

## 2019-01-28 DIAGNOSIS — J302 Other seasonal allergic rhinitis: Secondary | ICD-10-CM

## 2019-01-30 ENCOUNTER — Ambulatory Visit: Payer: BC Managed Care – PPO | Admitting: Internal Medicine

## 2019-01-30 ENCOUNTER — Telehealth: Payer: BC Managed Care – PPO | Admitting: Internal Medicine

## 2019-02-01 NOTE — Telephone Encounter (Signed)
Pt calling to find out what is going on with this RX refill request.

## 2019-02-08 ENCOUNTER — Other Ambulatory Visit: Payer: Self-pay

## 2019-02-08 ENCOUNTER — Encounter
Payer: BC Managed Care – PPO | Attending: Physical Medicine & Rehabilitation | Admitting: Physical Medicine & Rehabilitation

## 2019-02-08 ENCOUNTER — Encounter: Payer: Self-pay | Admitting: Physical Medicine & Rehabilitation

## 2019-02-08 VITALS — BP 123/81 | HR 61 | Temp 97.5°F | Ht 74.0 in | Wt 231.2 lb

## 2019-02-08 DIAGNOSIS — M791 Myalgia, unspecified site: Secondary | ICD-10-CM | POA: Insufficient documentation

## 2019-02-08 DIAGNOSIS — M2170 Unequal limb length (acquired), unspecified site: Secondary | ICD-10-CM

## 2019-02-08 DIAGNOSIS — Z9889 Other specified postprocedural states: Secondary | ICD-10-CM | POA: Diagnosis not present

## 2019-02-08 DIAGNOSIS — M47816 Spondylosis without myelopathy or radiculopathy, lumbar region: Secondary | ICD-10-CM

## 2019-02-08 DIAGNOSIS — Z87891 Personal history of nicotine dependence: Secondary | ICD-10-CM | POA: Insufficient documentation

## 2019-02-08 DIAGNOSIS — I4891 Unspecified atrial fibrillation: Secondary | ICD-10-CM | POA: Diagnosis not present

## 2019-02-08 DIAGNOSIS — E785 Hyperlipidemia, unspecified: Secondary | ICD-10-CM | POA: Insufficient documentation

## 2019-02-08 DIAGNOSIS — G8929 Other chronic pain: Secondary | ICD-10-CM | POA: Insufficient documentation

## 2019-02-08 DIAGNOSIS — M47817 Spondylosis without myelopathy or radiculopathy, lumbosacral region: Secondary | ICD-10-CM

## 2019-02-08 DIAGNOSIS — K219 Gastro-esophageal reflux disease without esophagitis: Secondary | ICD-10-CM | POA: Insufficient documentation

## 2019-02-08 DIAGNOSIS — M5442 Lumbago with sciatica, left side: Secondary | ICD-10-CM | POA: Insufficient documentation

## 2019-02-08 MED ORDER — AMITRIPTYLINE HCL 10 MG PO TABS: ORAL_TABLET | ORAL | 1 refills | Status: DC
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
## 2019-02-13 ENCOUNTER — Other Ambulatory Visit: Payer: Self-pay | Admitting: Family Medicine

## 2019-02-13 DIAGNOSIS — K219 Gastro-esophageal reflux disease without esophagitis: Secondary | ICD-10-CM

## 2019-02-16 NOTE — Telephone Encounter (Signed)
Relation to pt: self  Call back number: [phone]  Pharmacy: [id number] - [place], [place] - [address] [phone] (Phone) [phone] (Fax)     Reason for call:  Patient checking on the status of omeprazole (PRILOSEC) 40 MG capsule , request was sent in on 02/13/2019 please advise

## 2019-02-26 ENCOUNTER — Other Ambulatory Visit: Payer: Self-pay

## 2019-02-26 ENCOUNTER — Encounter: Payer: BC Managed Care – PPO | Admitting: Physical Medicine & Rehabilitation

## 2019-02-26 ENCOUNTER — Telehealth: Payer: Self-pay | Admitting: Internal Medicine

## 2019-02-26 ENCOUNTER — Encounter: Payer: Self-pay | Admitting: Physical Medicine & Rehabilitation

## 2019-02-26 DIAGNOSIS — G8929 Other chronic pain: Secondary | ICD-10-CM | POA: Diagnosis not present

## 2019-02-26 DIAGNOSIS — I4891 Unspecified atrial fibrillation: Secondary | ICD-10-CM | POA: Diagnosis not present

## 2019-02-26 DIAGNOSIS — M5442 Lumbago with sciatica, left side: Secondary | ICD-10-CM | POA: Diagnosis not present

## 2019-02-26 DIAGNOSIS — K219 Gastro-esophageal reflux disease without esophagitis: Secondary | ICD-10-CM | POA: Diagnosis not present

## 2019-02-26 DIAGNOSIS — M791 Myalgia, unspecified site: Secondary | ICD-10-CM | POA: Diagnosis not present

## 2019-02-26 DIAGNOSIS — Z87891 Personal history of nicotine dependence: Secondary | ICD-10-CM | POA: Diagnosis not present

## 2019-02-26 DIAGNOSIS — M47817 Spondylosis without myelopathy or radiculopathy, lumbosacral region: Secondary | ICD-10-CM

## 2019-02-26 DIAGNOSIS — E785 Hyperlipidemia, unspecified: Secondary | ICD-10-CM | POA: Diagnosis not present

## 2019-02-26 DIAGNOSIS — Z9889 Other specified postprocedural states: Secondary | ICD-10-CM | POA: Diagnosis not present

## 2019-02-26 NOTE — Progress Notes (Signed)
Bilateral Lumbar L2,, L3  medial branch blocks (to block the L3-4 facet joint) under fluoroscopic guidance  Indication: Lumbar pain which is not relieved by medication management or other conservative care and interfering with self-care and mobility.  Informed consent was obtained after describing risks and benefits of the procedure with the patient, this includes bleeding, infection, paralysis and medication side effects.  The patient wishes to proceed and has given written consent.  The patient was placed in prone position.  The lumbar area was marked and prepped with Betadine.  One mL of 1% lidocaine was injected into each of 4areas into the skin and subcutaneous tissue.  Then a 22-gauge 3.5 inch spinal needle was inserted targeting the left L4 superior articular process /transverse process junction   Bone contact was made.  Isovue 200 was injected x 0.5 mL demonstrating no intravascular uptake.  Then a solution containing2% MPF lidocaine was injected x 0.5 mL. Then a 22-gauge 3.5 inch spinal needle was inserted targeting the left L3 superior articular process / transverse process junction   Bone contact was made.  Isovue 200 was injected x 0.5 mL demonstrating no intravascular uptake.  Then a solution containing2% MPF lidocaine was injected x 0.5 mL.  This same procedure was performed on the right side using the same needle, technique and injectate.  Patient tolerated procedure well.  Post procedure instructions were given.  Preinjection pain 4/10 Post injection pain 0/10

## 2019-02-26 NOTE — Telephone Encounter (Signed)
Follow Up   Patient calling back to confirm his appointment and do the COVID-19 screening. Please give patient a call back.

## 2019-02-26 NOTE — Progress Notes (Signed)
  PROCEDURE RECORD [facility]   Name: George Singleton DOB:1981-05-22 MRN: [id number]  Date:02/26/2019  Physician: Alysia Penna, MD    Nurse/CMA: Bright CMA  Allergies: No Known Allergies  Consent Signed: Yes.    Is patient diabetic? No.  CBG today? NA  Pregnant: No. LMP: No LMP for male patient. (age 40-55)  Anticoagulants: no Anti-inflammatory: no Antibiotics: no  Procedure: Bilateral L3-4 MBBs Position: Prone   Start Time: 1257pm End Time: 104pm Fluoro Time: 36s  RN/CMA Bright CMA Bright CMA    Time 1231pm 110pm    BP 119/75 125/80    Pulse 63 65    Respirations 16 16    O2 Sat 95 96    S/S 6 6    Pain Level 4/10 0/10     D/C home with Self, patient A & O X 3, D/C instructions reviewed, and sits independently.

## 2019-02-26 NOTE — Telephone Encounter (Signed)
New Message ° ° ° °Left message to confirm appt and answer covid questions  °

## 2019-02-26 NOTE — Patient Instructions (Signed)

## 2019-02-27 ENCOUNTER — Other Ambulatory Visit: Payer: Self-pay

## 2019-02-27 ENCOUNTER — Encounter: Payer: Self-pay | Admitting: Internal Medicine

## 2019-02-27 ENCOUNTER — Ambulatory Visit: Payer: BC Managed Care – PPO | Admitting: Internal Medicine

## 2019-02-27 VITALS — BP 122/68 | HR 59 | Ht 74.0 in | Wt 226.0 lb

## 2019-02-27 DIAGNOSIS — I480 Paroxysmal atrial fibrillation: Secondary | ICD-10-CM

## 2019-02-27 DIAGNOSIS — E7800 Pure hypercholesterolemia, unspecified: Secondary | ICD-10-CM

## 2019-02-27 NOTE — Patient Instructions (Signed)
Medication Instructions:  Your physician has recommended you make the following change in your medication:   Stop Aspirin  Labwork: None ordered.  Testing/Procedures: None ordered.  Follow-Up: Your physician recommends that you schedule a follow-up appointment in:   32 mo with Dr. Caryl Comes   Any Other Special Instructions Will Be Listed Below (If Applicable).     If you need a refill on your cardiac medications before your next appointment, please call your pharmacy.
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
## 2019-02-27 NOTE — Telephone Encounter (Signed)

## 2019-03-19 ENCOUNTER — Other Ambulatory Visit: Payer: Self-pay

## 2019-03-19 ENCOUNTER — Encounter
Payer: BC Managed Care – PPO | Attending: Physical Medicine & Rehabilitation | Admitting: Physical Medicine & Rehabilitation

## 2019-03-19 ENCOUNTER — Encounter: Payer: Self-pay | Admitting: Physical Medicine & Rehabilitation

## 2019-03-19 VITALS — BP 114/75 | HR 58 | Temp 98.5°F | Resp 18 | Ht 74.0 in | Wt 224.8 lb

## 2019-03-19 DIAGNOSIS — I4891 Unspecified atrial fibrillation: Secondary | ICD-10-CM | POA: Insufficient documentation

## 2019-03-19 DIAGNOSIS — Z87891 Personal history of nicotine dependence: Secondary | ICD-10-CM | POA: Insufficient documentation

## 2019-03-19 DIAGNOSIS — M47817 Spondylosis without myelopathy or radiculopathy, lumbosacral region: Secondary | ICD-10-CM

## 2019-03-19 DIAGNOSIS — M791 Myalgia, unspecified site: Secondary | ICD-10-CM | POA: Diagnosis not present

## 2019-03-19 DIAGNOSIS — Z9889 Other specified postprocedural states: Secondary | ICD-10-CM | POA: Diagnosis not present

## 2019-03-19 DIAGNOSIS — M47816 Spondylosis without myelopathy or radiculopathy, lumbar region: Secondary | ICD-10-CM

## 2019-03-19 DIAGNOSIS — E785 Hyperlipidemia, unspecified: Secondary | ICD-10-CM | POA: Insufficient documentation

## 2019-03-19 DIAGNOSIS — K219 Gastro-esophageal reflux disease without esophagitis: Secondary | ICD-10-CM | POA: Insufficient documentation

## 2019-03-19 DIAGNOSIS — M5417 Radiculopathy, lumbosacral region: Secondary | ICD-10-CM | POA: Diagnosis not present

## 2019-03-19 DIAGNOSIS — M2170 Unequal limb length (acquired), unspecified site: Secondary | ICD-10-CM | POA: Diagnosis not present

## 2019-03-19 DIAGNOSIS — G8929 Other chronic pain: Secondary | ICD-10-CM | POA: Insufficient documentation

## 2019-03-19 DIAGNOSIS — M25551 Pain in right hip: Secondary | ICD-10-CM

## 2019-03-19 DIAGNOSIS — M5442 Lumbago with sciatica, left side: Secondary | ICD-10-CM | POA: Diagnosis not present
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
## 2019-03-20 ENCOUNTER — Telehealth: Payer: Self-pay

## 2019-03-20 NOTE — Telephone Encounter (Signed)
I called him back and let him know it is not really much different than what he has experienced with the MBB, at most he would expect to be here in the office under an hour and recovery is very similar to the bil MBB.

## 2019-03-20 NOTE — Telephone Encounter (Signed)
Patient called and left a message on the triage line and stated that he has some questions for his upcoming procedure on 04/05/2019. He would like to know: How long will the procedure take so he can inform his driver? What is the recovery time? What are the restrictions after the procedure?  Will there be bandage changes?

## 2019-03-26 ENCOUNTER — Ambulatory Visit: Payer: BC Managed Care – PPO | Admitting: Physical Medicine & Rehabilitation

## 2019-03-27 ENCOUNTER — Ambulatory Visit: Payer: BC Managed Care – PPO | Admitting: Physical Medicine & Rehabilitation

## 2019-03-27 DIAGNOSIS — L239 Allergic contact dermatitis, unspecified cause: Secondary | ICD-10-CM | POA: Diagnosis not present

## 2019-03-27 DIAGNOSIS — L821 Other seborrheic keratosis: Secondary | ICD-10-CM | POA: Diagnosis not present

## 2019-03-27 DIAGNOSIS — D1801 Hemangioma of skin and subcutaneous tissue: Secondary | ICD-10-CM | POA: Diagnosis not present

## 2019-03-27 DIAGNOSIS — D225 Melanocytic nevi of trunk: Secondary | ICD-10-CM | POA: Diagnosis not present

## 2019-04-05 ENCOUNTER — Encounter
Payer: BC Managed Care – PPO | Attending: Physical Medicine & Rehabilitation | Admitting: Physical Medicine & Rehabilitation

## 2019-04-05 ENCOUNTER — Other Ambulatory Visit: Payer: Self-pay

## 2019-04-05 ENCOUNTER — Encounter: Payer: Self-pay | Admitting: Physical Medicine & Rehabilitation

## 2019-04-05 VITALS — BP 119/81 | HR 62 | Temp 98.9°F | Ht 74.0 in | Wt 222.0 lb

## 2019-04-05 DIAGNOSIS — M791 Myalgia, unspecified site: Secondary | ICD-10-CM | POA: Insufficient documentation

## 2019-04-05 DIAGNOSIS — E785 Hyperlipidemia, unspecified: Secondary | ICD-10-CM | POA: Diagnosis not present

## 2019-04-05 DIAGNOSIS — K219 Gastro-esophageal reflux disease without esophagitis: Secondary | ICD-10-CM | POA: Diagnosis not present

## 2019-04-05 DIAGNOSIS — I4891 Unspecified atrial fibrillation: Secondary | ICD-10-CM | POA: Insufficient documentation

## 2019-04-05 DIAGNOSIS — Z87891 Personal history of nicotine dependence: Secondary | ICD-10-CM | POA: Diagnosis not present

## 2019-04-05 DIAGNOSIS — G8929 Other chronic pain: Secondary | ICD-10-CM | POA: Diagnosis not present

## 2019-04-05 DIAGNOSIS — Z9889 Other specified postprocedural states: Secondary | ICD-10-CM | POA: Insufficient documentation

## 2019-04-05 DIAGNOSIS — M5442 Lumbago with sciatica, left side: Secondary | ICD-10-CM | POA: Insufficient documentation

## 2019-04-05 DIAGNOSIS — M47817 Spondylosis without myelopathy or radiculopathy, lumbosacral region: Secondary | ICD-10-CM | POA: Diagnosis not present

## 2019-04-05 NOTE — Progress Notes (Signed)
  PROCEDURE RECORD [facility]   Name: Christon Tinder DOB:11-12-1980 MRN: [id number]  Date:04/05/2019  Physician: Alysia Penna, MD    Nurse/CMA: Ardean Melroy CMA  Allergies: No Known Allergies  Consent Signed: Yes.    Is patient diabetic? No.  CBG today? NA  Pregnant: No. LMP: No LMP for male patient. (age 40-55)  Anticoagulants: no Anti-inflammatory: no Antibiotics: no  Procedure: Left L2-4 RFA Position: Prone   Start Time: 3:50pm End Time: 4:10pm Fluoro Time: 40  RN/CMA Juan Kissoon CMA Phallon Haydu CMA    Time 306pm 4:20pm    BP 119/81 129/86    Pulse 62 60    Respirations 16 16    O2 Sat 96 96    S/S 6 6    Pain Level 4/10 1/10     D/C home with Wife, patient A & O X 3, D/C instructions reviewed, and sits independently.

## 2019-04-05 NOTE — Patient Instructions (Addendum)
You had a radio frequency procedure today This was done to alleviate joint pain in your lumbar area We injected lidocaine which is a local anesthetic.  You may experience soreness at the injection sites. You may also experienced some irritation of the nerves that were heated I'm recommending ice for 30 minutes every 2 hours as needed for the next 24-48 hours  May double up on your Lyrica today

## 2019-04-05 NOTE — Progress Notes (Signed)
LEFT L3 and ,L2 medial branch radio frequency neurotomy under fluoroscopic guidance   Indication: Low back pain due to lumbar spondylosis which has been relieved on 2 occasions by greater than 50% by lumbar medial branch blocks at corresponding levels.  Informed consent was obtained after describing risks and benefits of the procedure with the patient, this includes bleeding, bruising, infection, paralysis and medication side effects. The patient wishes to proceed and has given written consent. The patient was placed in a prone position. The lumbar and sacral area was marked and prepped with Betadine. A 25-gauge 1-1/2 inch needle was inserted into the skin and subcutaneous tissue at 3 sites in one ML of 1% lidocaine was injected into each site. Then a 18-gauge 10 cm radio frequency needle with a 1 cm curved active tip was inserted targeting the LEFT L3 SAP/transverse process junction. Bone contact was made and confirmed with lateral imaging.  motor stimulation at 2 Hz confirmed proper needle location followed by injection of one ML of the solution containing 62ml 1% MPF lidocaine. Then the LEFT L4 SAP/transverse process junction was targeted. Bone contact was made and confirmed with lateral imaging.  motor stimulation at 2 Hz confirm proper needle location followed by injection of one ML 1% MPF lidocaine.  Radio frequency lesion being at [facility] for 90 seconds was performed. Needles were removed. Post procedure instructions and vital signs were performed. Patient tolerated procedure well. Followup appointment was given.

## 2019-04-13 ENCOUNTER — Telehealth: Payer: Self-pay | Admitting: Internal Medicine

## 2019-04-13 MED ORDER — DILTIAZEM GEL 2 %: CUTANEOUS | 0 refills | Status: DC

## 2019-04-13 NOTE — Addendum Note (Signed)
Addended by: Audrea Muscat on: 04/13/2019 04:22 PM   Modules accepted: Orders

## 2019-04-13 NOTE — Telephone Encounter (Signed)
Yes.  Keep office follow-up.  Thanks

## 2019-04-13 NOTE — Telephone Encounter (Signed)
Patient last seen for anal fissure 09/2016. He has an upcoming office visit with you on 05/08/2019.  Ok to refill Diltiazem?

## 2019-04-13 NOTE — Telephone Encounter (Signed)
Diltiazem sent to [facility].  Sent patient message through mychart to let him know.

## 2019-04-16 ENCOUNTER — Telehealth: Payer: Self-pay

## 2019-04-16 ENCOUNTER — Encounter: Payer: 59 | Admitting: Family Medicine

## 2019-04-16 NOTE — Telephone Encounter (Signed)
[employer] DENYING 09.03.20 HAVE FAXED 11 MONTHS WORTH OF DOCUMENTATION INCLUDING MRI TO THEM - ORIGINAL DENIAL AND FAX DOCUMENT ON MY DESK

## 2019-04-18 DIAGNOSIS — D225 Melanocytic nevi of trunk: Secondary | ICD-10-CM | POA: Diagnosis not present

## 2019-04-18 DIAGNOSIS — B070 Plantar wart: Secondary | ICD-10-CM | POA: Diagnosis not present

## 2019-04-26 DIAGNOSIS — I4891 Unspecified atrial fibrillation: Secondary | ICD-10-CM | POA: Diagnosis not present

## 2019-04-26 DIAGNOSIS — E785 Hyperlipidemia, unspecified: Secondary | ICD-10-CM | POA: Diagnosis not present

## 2019-04-26 DIAGNOSIS — M5440 Lumbago with sciatica, unspecified side: Secondary | ICD-10-CM | POA: Diagnosis not present

## 2019-04-26 DIAGNOSIS — K219 Gastro-esophageal reflux disease without esophagitis: Secondary | ICD-10-CM | POA: Diagnosis not present

## 2019-05-03 ENCOUNTER — Ambulatory Visit: Payer: BC Managed Care – PPO | Admitting: Physical Medicine & Rehabilitation

## 2019-05-03 ENCOUNTER — Other Ambulatory Visit: Payer: Self-pay | Admitting: Family Medicine

## 2019-05-03 DIAGNOSIS — J302 Other seasonal allergic rhinitis: Secondary | ICD-10-CM

## 2019-05-07 DIAGNOSIS — B078 Other viral warts: Secondary | ICD-10-CM | POA: Diagnosis not present

## 2019-05-08 ENCOUNTER — Ambulatory Visit: Payer: BC Managed Care – PPO | Admitting: Internal Medicine

## 2019-05-08 ENCOUNTER — Encounter: Payer: Self-pay | Admitting: Internal Medicine

## 2019-05-08 ENCOUNTER — Other Ambulatory Visit: Payer: Self-pay

## 2019-05-08 VITALS — BP 102/70 | HR 68 | Temp 98.5°F | Ht 73.0 in | Wt 222.5 lb

## 2019-05-08 DIAGNOSIS — K602 Anal fissure, unspecified: Secondary | ICD-10-CM

## 2019-05-08 NOTE — Patient Instructions (Signed)
Make sure to use sitz baths when the fissure is bothering you.  Begin taking 2 tablespoons of Citrucel daily (discontinue Metamucil)
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
## 2019-05-10 ENCOUNTER — Other Ambulatory Visit: Payer: Self-pay

## 2019-05-10 ENCOUNTER — Other Ambulatory Visit: Payer: Self-pay | Admitting: Family Medicine

## 2019-05-10 ENCOUNTER — Ambulatory Visit (INDEPENDENT_AMBULATORY_CARE_PROVIDER_SITE_OTHER): Payer: BC Managed Care – PPO

## 2019-05-10 DIAGNOSIS — K219 Gastro-esophageal reflux disease without esophagitis: Secondary | ICD-10-CM

## 2019-05-10 DIAGNOSIS — Z23 Encounter for immunization: Secondary | ICD-10-CM | POA: Diagnosis not present

## 2019-05-14 ENCOUNTER — Other Ambulatory Visit: Payer: Self-pay | Admitting: Physical Medicine & Rehabilitation

## 2019-05-21 ENCOUNTER — Other Ambulatory Visit: Payer: Self-pay

## 2019-05-21 MED ORDER — DILTIAZEM GEL 2 %: CUTANEOUS | 0 refills | Status: DC

## 2019-06-07 DIAGNOSIS — B078 Other viral warts: Secondary | ICD-10-CM | POA: Diagnosis not present

## 2019-06-25 ENCOUNTER — Encounter
Payer: BC Managed Care – PPO | Attending: Physical Medicine & Rehabilitation | Admitting: Physical Medicine & Rehabilitation

## 2019-06-25 ENCOUNTER — Encounter: Payer: Self-pay | Admitting: Physical Medicine & Rehabilitation

## 2019-06-25 ENCOUNTER — Other Ambulatory Visit: Payer: Self-pay

## 2019-06-25 VITALS — BP 136/85 | HR 67 | Temp 97.7°F | Ht 73.0 in | Wt 230.6 lb

## 2019-06-25 DIAGNOSIS — K219 Gastro-esophageal reflux disease without esophagitis: Secondary | ICD-10-CM | POA: Diagnosis not present

## 2019-06-25 DIAGNOSIS — M47816 Spondylosis without myelopathy or radiculopathy, lumbar region: Secondary | ICD-10-CM | POA: Diagnosis not present

## 2019-06-25 DIAGNOSIS — G8929 Other chronic pain: Secondary | ICD-10-CM | POA: Diagnosis not present

## 2019-06-25 DIAGNOSIS — M47817 Spondylosis without myelopathy or radiculopathy, lumbosacral region: Secondary | ICD-10-CM

## 2019-06-25 DIAGNOSIS — M5417 Radiculopathy, lumbosacral region: Secondary | ICD-10-CM | POA: Diagnosis not present

## 2019-06-25 DIAGNOSIS — M791 Myalgia, unspecified site: Secondary | ICD-10-CM | POA: Diagnosis not present

## 2019-06-25 DIAGNOSIS — Z9889 Other specified postprocedural states: Secondary | ICD-10-CM | POA: Diagnosis not present

## 2019-06-25 DIAGNOSIS — M25562 Pain in left knee: Secondary | ICD-10-CM | POA: Insufficient documentation

## 2019-06-25 DIAGNOSIS — M5442 Lumbago with sciatica, left side: Secondary | ICD-10-CM | POA: Insufficient documentation

## 2019-06-25 DIAGNOSIS — E785 Hyperlipidemia, unspecified: Secondary | ICD-10-CM | POA: Insufficient documentation

## 2019-06-25 DIAGNOSIS — Z87891 Personal history of nicotine dependence: Secondary | ICD-10-CM | POA: Diagnosis not present

## 2019-06-25 DIAGNOSIS — I4891 Unspecified atrial fibrillation: Secondary | ICD-10-CM | POA: Insufficient documentation

## 2019-06-25 MED ORDER — AMITRIPTYLINE HCL 10 MG PO TABS: ORAL_TABLET | ORAL | 1 refills | Status: DC

## 2019-06-25 MED ORDER — DICLOFENAC SODIUM 1 % EX GEL: 2.0000 g | Freq: Four times a day (QID) | CUTANEOUS | 1 refills | Status: DC
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
## 2019-06-27 ENCOUNTER — Telehealth: Payer: Self-pay | Admitting: *Deleted

## 2019-06-27 NOTE — Telephone Encounter (Signed)
Prior authorization submitted to covermymeds for diclofenac sodium gel 1%.  Approved:   06/27/2019 through 06/25/2022

## 2019-07-05 ENCOUNTER — Ambulatory Visit (HOSPITAL_COMMUNITY)
Admission: RE | Admit: 2019-07-05 | Discharge: 2019-07-05 | Disposition: A | Discharge status: Home / Self Care | Payer: BC Managed Care – PPO | Source: Ambulatory Visit | Attending: Physical Medicine & Rehabilitation | Admitting: Physical Medicine & Rehabilitation

## 2019-07-05 ENCOUNTER — Other Ambulatory Visit: Payer: Self-pay

## 2019-07-05 DIAGNOSIS — M25562 Pain in left knee: Secondary | ICD-10-CM | POA: Insufficient documentation

## 2019-07-11 DIAGNOSIS — B078 Other viral warts: Secondary | ICD-10-CM | POA: Diagnosis not present

## 2019-07-19 DIAGNOSIS — H1013 Acute atopic conjunctivitis, bilateral: Secondary | ICD-10-CM | POA: Diagnosis not present

## 2019-07-19 DIAGNOSIS — Z973 Presence of spectacles and contact lenses: Secondary | ICD-10-CM | POA: Diagnosis not present

## 2019-07-19 DIAGNOSIS — H527 Unspecified disorder of refraction: Secondary | ICD-10-CM | POA: Diagnosis not present

## 2019-07-19 DIAGNOSIS — H04123 Dry eye syndrome of bilateral lacrimal glands: Secondary | ICD-10-CM | POA: Diagnosis not present

## 2019-07-30 ENCOUNTER — Encounter: Payer: Self-pay | Admitting: Physical Medicine & Rehabilitation

## 2019-07-30 ENCOUNTER — Other Ambulatory Visit: Payer: Self-pay

## 2019-07-30 ENCOUNTER — Encounter
Payer: BC Managed Care – PPO | Attending: Physical Medicine & Rehabilitation | Admitting: Physical Medicine & Rehabilitation

## 2019-07-30 VITALS — BP 127/86 | HR 74 | Temp 97.7°F | Ht 73.0 in | Wt 246.0 lb

## 2019-07-30 DIAGNOSIS — M47817 Spondylosis without myelopathy or radiculopathy, lumbosacral region: Secondary | ICD-10-CM | POA: Diagnosis not present

## 2019-07-30 DIAGNOSIS — M25562 Pain in left knee: Secondary | ICD-10-CM

## 2019-07-30 DIAGNOSIS — M47816 Spondylosis without myelopathy or radiculopathy, lumbar region: Secondary | ICD-10-CM

## 2019-07-30 DIAGNOSIS — M5417 Radiculopathy, lumbosacral region: Secondary | ICD-10-CM

## 2019-07-30 DIAGNOSIS — Z87891 Personal history of nicotine dependence: Secondary | ICD-10-CM | POA: Diagnosis not present

## 2019-07-30 DIAGNOSIS — M791 Myalgia, unspecified site: Secondary | ICD-10-CM

## 2019-07-30 DIAGNOSIS — G8929 Other chronic pain: Secondary | ICD-10-CM | POA: Diagnosis not present

## 2019-07-30 DIAGNOSIS — K219 Gastro-esophageal reflux disease without esophagitis: Secondary | ICD-10-CM | POA: Insufficient documentation

## 2019-07-30 DIAGNOSIS — M5442 Lumbago with sciatica, left side: Secondary | ICD-10-CM | POA: Diagnosis not present

## 2019-07-30 DIAGNOSIS — M76892 Other specified enthesopathies of left lower limb, excluding foot: Secondary | ICD-10-CM

## 2019-07-30 DIAGNOSIS — E785 Hyperlipidemia, unspecified: Secondary | ICD-10-CM | POA: Insufficient documentation

## 2019-07-30 DIAGNOSIS — I4891 Unspecified atrial fibrillation: Secondary | ICD-10-CM | POA: Diagnosis not present

## 2019-07-30 DIAGNOSIS — Z9889 Other specified postprocedural states: Secondary | ICD-10-CM | POA: Diagnosis not present

## 2019-07-30 MED ORDER — DULOXETINE HCL 30 MG PO CPEP: 30.0000 mg | ORAL_CAPSULE | Freq: Every day | ORAL | 1 refills | Status: DC
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
## 2019-08-06 ENCOUNTER — Other Ambulatory Visit: Payer: Self-pay | Admitting: Family Medicine

## 2019-08-06 DIAGNOSIS — K219 Gastro-esophageal reflux disease without esophagitis: Secondary | ICD-10-CM

## 2019-08-07 DIAGNOSIS — B078 Other viral warts: Secondary | ICD-10-CM | POA: Diagnosis not present

## 2019-08-15 ENCOUNTER — Encounter: Payer: Self-pay | Admitting: Family Medicine

## 2019-08-15 ENCOUNTER — Other Ambulatory Visit: Payer: Self-pay | Admitting: Family Medicine

## 2019-08-15 DIAGNOSIS — K219 Gastro-esophageal reflux disease without esophagitis: Secondary | ICD-10-CM

## 2019-08-15 MED ORDER — OMEPRAZOLE 40 MG PO CPDR: DELAYED_RELEASE_CAPSULE | ORAL | 1 refills | Status: DC

## 2019-08-19 ENCOUNTER — Other Ambulatory Visit: Payer: Self-pay | Admitting: Physical Medicine & Rehabilitation

## 2019-08-20 NOTE — Telephone Encounter (Signed)
Recieved electronic medication refill request for Lyrica and Voltaren Gel.  Refilled Voltaren Gel using clinical protocols, unable to call in Lyrica due to change in policy of controlled medication.  Must now be E-scribed by Provider.  Please refill Lyrica for this patient.

## 2019-08-23 ENCOUNTER — Other Ambulatory Visit: Payer: Self-pay | Admitting: Physical Medicine & Rehabilitation

## 2019-08-28 ENCOUNTER — Encounter: Payer: Self-pay | Admitting: Physical Medicine & Rehabilitation

## 2019-08-28 ENCOUNTER — Other Ambulatory Visit: Payer: Self-pay

## 2019-08-28 ENCOUNTER — Encounter
Payer: BC Managed Care – PPO | Attending: Physical Medicine & Rehabilitation | Admitting: Physical Medicine & Rehabilitation

## 2019-08-28 VITALS — BP 117/78 | HR 70 | Temp 97.7°F | Ht 73.0 in | Wt 223.0 lb

## 2019-08-28 DIAGNOSIS — Z9889 Other specified postprocedural states: Secondary | ICD-10-CM | POA: Diagnosis not present

## 2019-08-28 DIAGNOSIS — M5442 Lumbago with sciatica, left side: Secondary | ICD-10-CM | POA: Insufficient documentation

## 2019-08-28 DIAGNOSIS — I4891 Unspecified atrial fibrillation: Secondary | ICD-10-CM | POA: Diagnosis not present

## 2019-08-28 DIAGNOSIS — Z87891 Personal history of nicotine dependence: Secondary | ICD-10-CM | POA: Diagnosis not present

## 2019-08-28 DIAGNOSIS — E785 Hyperlipidemia, unspecified: Secondary | ICD-10-CM | POA: Insufficient documentation

## 2019-08-28 DIAGNOSIS — G8929 Other chronic pain: Secondary | ICD-10-CM | POA: Diagnosis not present

## 2019-08-28 DIAGNOSIS — M791 Myalgia, unspecified site: Secondary | ICD-10-CM | POA: Diagnosis not present

## 2019-08-28 DIAGNOSIS — K219 Gastro-esophageal reflux disease without esophagitis: Secondary | ICD-10-CM | POA: Diagnosis not present

## 2019-08-28 DIAGNOSIS — M47816 Spondylosis without myelopathy or radiculopathy, lumbar region: Secondary | ICD-10-CM

## 2019-08-28 DIAGNOSIS — M25562 Pain in left knee: Secondary | ICD-10-CM | POA: Insufficient documentation

## 2019-08-28 NOTE — Progress Notes (Addendum)
Indication:  Chronic facet arthropathy only partially responsive to physical therapy, numerous medications including, benefit with RFA in the past.    Informed consent was obtained after discussing risks and benefits of the procedure with patient these include bleeding, bruising, and infection.  The patient elects to proceed and has given written consent.  The patient was brought into the procedure room and placed in the prone position.    The right lower lumbar paraspinal area was cleaned and prepped with povidone-iodine with a wide margin and allowed to dry.  Sterile drape was placed with fenestration over the target site.  The peripheral neurostimulator was placed outside of the sterile field over clean skin on upper back of the affected side.   The multifidus muscle was visualized using ultrasound.  The skin overlying the lead site was anesthetized with 4cc of 1% Lidocaine. Care was taken to assure that local anesthetic was not administered close to the target neurostimulator lead site to prevent an altered response to stimulation.   Test stimulation was delivered via a 17 gauge percutaneous sleeve and 19 gauge stimulating probe inside the sleeve to assist in identifying the optimal lead location.  The right lower lumbar multifidus muscle was targeted.  Once this point was located, the stimulating probe was inserted, using ultrasound guidance, at the marked point above using sterile technique.  The probe was advanced until bone was felt and then slightly retracted.     A provided test cable was connected to the probe and the intensity was adjusted until comfortable muscle contraction of the multifidus produced by the neurostimulator was detected by the patient. Once optimal location was identified, stimulation was turned off, the test cable was disconnected from the stimulating probe, and the stimulating probe was removed from the sleeve.  The 20 gauge Microlead Introducer containing the lead  was inserted into the percutaneous sleeve and advanced to the established target depth. The connector box was connected to the de-inuslated end of the lead and the cable was connected to the Stimulator. Optimized stimulation parameters were tested again beginning at slightly lower intensity. Intensity was adjusted until the desired response was obtained, while visualizing multifidus contractions. The location and depth of the electrode was noted. Stimulation was turned off. The connector box was  disconnected from the microlead, and the introducer and sleeve were then withdrawn while manual pressure was applied at the exit site at which point the lead was deployed, secured and implanted. A drop of dermabond was placed at the electrode exit site.  The connector box was reconnected and stimulation was again delivered to confirm that the lead did not move upon removal of the introducer. The lead position was confirmed by the presence of comfortable sensations in the painful areas. The lead was coiled to allow for strain relief then threaded through and secured into the connector box and secured in a cradle.  Excess lead was trimmed to length. The cable was inserted into the neurostimulator and the neurostimulator was positioned on the anterior thigh below the lead exit site at which point the final stimulation response again confirmed optimal lead placement.   The area, including the lead, connecting box and cable, was dressed with occlusive dressing and the patient and caregiver were instructed on the proper management of the site and how to operate the External Pulse Generator (EPG) and the hand held patient programmer.   Follow up appt in 2 wks

## 2019-08-31 DIAGNOSIS — I4891 Unspecified atrial fibrillation: Secondary | ICD-10-CM | POA: Diagnosis not present

## 2019-08-31 DIAGNOSIS — M549 Dorsalgia, unspecified: Secondary | ICD-10-CM | POA: Diagnosis not present

## 2019-08-31 DIAGNOSIS — M5432 Sciatica, left side: Secondary | ICD-10-CM | POA: Diagnosis not present

## 2019-08-31 DIAGNOSIS — G8929 Other chronic pain: Secondary | ICD-10-CM | POA: Diagnosis not present

## 2019-09-06 DIAGNOSIS — B078 Other viral warts: Secondary | ICD-10-CM | POA: Diagnosis not present

## 2019-09-08 DIAGNOSIS — Z23 Encounter for immunization: Secondary | ICD-10-CM | POA: Diagnosis not present

## 2019-09-11 ENCOUNTER — Encounter: Payer: Self-pay | Admitting: Physical Medicine & Rehabilitation

## 2019-09-11 ENCOUNTER — Other Ambulatory Visit: Payer: Self-pay

## 2019-09-11 ENCOUNTER — Encounter
Payer: BC Managed Care – PPO | Attending: Physical Medicine & Rehabilitation | Admitting: Physical Medicine & Rehabilitation

## 2019-09-11 VITALS — BP 117/79 | HR 63 | Temp 97.9°F | Ht 73.0 in | Wt 219.0 lb

## 2019-09-11 DIAGNOSIS — M47817 Spondylosis without myelopathy or radiculopathy, lumbosacral region: Secondary | ICD-10-CM | POA: Diagnosis not present

## 2019-09-11 DIAGNOSIS — I4891 Unspecified atrial fibrillation: Secondary | ICD-10-CM | POA: Insufficient documentation

## 2019-09-11 DIAGNOSIS — Z9889 Other specified postprocedural states: Secondary | ICD-10-CM | POA: Insufficient documentation

## 2019-09-11 DIAGNOSIS — E785 Hyperlipidemia, unspecified: Secondary | ICD-10-CM | POA: Insufficient documentation

## 2019-09-11 DIAGNOSIS — G8929 Other chronic pain: Secondary | ICD-10-CM

## 2019-09-11 DIAGNOSIS — M5442 Lumbago with sciatica, left side: Secondary | ICD-10-CM | POA: Diagnosis not present

## 2019-09-11 DIAGNOSIS — M47816 Spondylosis without myelopathy or radiculopathy, lumbar region: Secondary | ICD-10-CM

## 2019-09-11 DIAGNOSIS — K219 Gastro-esophageal reflux disease without esophagitis: Secondary | ICD-10-CM | POA: Insufficient documentation

## 2019-09-11 DIAGNOSIS — Z87891 Personal history of nicotine dependence: Secondary | ICD-10-CM | POA: Insufficient documentation

## 2019-09-11 DIAGNOSIS — M791 Myalgia, unspecified site: Secondary | ICD-10-CM

## 2019-09-11 DIAGNOSIS — M25562 Pain in left knee: Secondary | ICD-10-CM | POA: Diagnosis not present
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
## 2019-09-29 DIAGNOSIS — Z23 Encounter for immunization: Secondary | ICD-10-CM | POA: Diagnosis not present

## 2019-10-04 DIAGNOSIS — B078 Other viral warts: Secondary | ICD-10-CM | POA: Diagnosis not present

## 2019-10-09 ENCOUNTER — Other Ambulatory Visit: Payer: Self-pay

## 2019-10-09 ENCOUNTER — Encounter
Payer: BC Managed Care – PPO | Attending: Physical Medicine & Rehabilitation | Admitting: Physical Medicine & Rehabilitation

## 2019-10-09 ENCOUNTER — Encounter: Payer: Self-pay | Admitting: Physical Medicine & Rehabilitation

## 2019-10-09 VITALS — BP 115/77 | HR 60 | Temp 98.8°F | Ht 73.0 in | Wt 211.0 lb

## 2019-10-09 DIAGNOSIS — M5442 Lumbago with sciatica, left side: Secondary | ICD-10-CM | POA: Diagnosis not present

## 2019-10-09 DIAGNOSIS — G8929 Other chronic pain: Secondary | ICD-10-CM | POA: Insufficient documentation

## 2019-10-09 DIAGNOSIS — Z9889 Other specified postprocedural states: Secondary | ICD-10-CM | POA: Insufficient documentation

## 2019-10-09 DIAGNOSIS — K219 Gastro-esophageal reflux disease without esophagitis: Secondary | ICD-10-CM | POA: Diagnosis not present

## 2019-10-09 DIAGNOSIS — M47816 Spondylosis without myelopathy or radiculopathy, lumbar region: Secondary | ICD-10-CM

## 2019-10-09 DIAGNOSIS — E785 Hyperlipidemia, unspecified: Secondary | ICD-10-CM | POA: Diagnosis not present

## 2019-10-09 DIAGNOSIS — M25562 Pain in left knee: Secondary | ICD-10-CM

## 2019-10-09 DIAGNOSIS — I4891 Unspecified atrial fibrillation: Secondary | ICD-10-CM | POA: Diagnosis not present

## 2019-10-09 DIAGNOSIS — M791 Myalgia, unspecified site: Secondary | ICD-10-CM | POA: Diagnosis not present

## 2019-10-09 DIAGNOSIS — M47817 Spondylosis without myelopathy or radiculopathy, lumbosacral region: Secondary | ICD-10-CM | POA: Diagnosis not present

## 2019-10-09 DIAGNOSIS — Z87891 Personal history of nicotine dependence: Secondary | ICD-10-CM | POA: Insufficient documentation

## 2019-10-09 DIAGNOSIS — M76892 Other specified enthesopathies of left lower limb, excluding foot: Secondary | ICD-10-CM | POA: Insufficient documentation

## 2019-10-09 MED ORDER — PREGABALIN 25 MG PO CAPS: 25.0000 mg | ORAL_CAPSULE | Freq: Two times a day (BID) | ORAL | 1 refills | Status: DC
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
## 2019-10-23 ENCOUNTER — Ambulatory Visit: Payer: BC Managed Care – PPO | Admitting: Physical Medicine & Rehabilitation

## 2019-10-23 ENCOUNTER — Encounter: Payer: BC Managed Care – PPO | Admitting: Physical Medicine & Rehabilitation

## 2019-10-23 ENCOUNTER — Encounter: Payer: Self-pay | Admitting: Physical Medicine & Rehabilitation

## 2019-10-23 ENCOUNTER — Other Ambulatory Visit: Payer: Self-pay

## 2019-10-23 VITALS — BP 122/78 | HR 56 | Ht 73.0 in | Wt 205.0 lb

## 2019-10-23 DIAGNOSIS — M791 Myalgia, unspecified site: Secondary | ICD-10-CM | POA: Diagnosis not present

## 2019-10-23 DIAGNOSIS — K219 Gastro-esophageal reflux disease without esophagitis: Secondary | ICD-10-CM | POA: Diagnosis not present

## 2019-10-23 DIAGNOSIS — E785 Hyperlipidemia, unspecified: Secondary | ICD-10-CM | POA: Diagnosis not present

## 2019-10-23 DIAGNOSIS — Z9889 Other specified postprocedural states: Secondary | ICD-10-CM | POA: Diagnosis not present

## 2019-10-23 DIAGNOSIS — Z87891 Personal history of nicotine dependence: Secondary | ICD-10-CM | POA: Diagnosis not present

## 2019-10-23 DIAGNOSIS — M47816 Spondylosis without myelopathy or radiculopathy, lumbar region: Secondary | ICD-10-CM

## 2019-10-23 DIAGNOSIS — M25562 Pain in left knee: Secondary | ICD-10-CM | POA: Diagnosis not present

## 2019-10-23 DIAGNOSIS — G8929 Other chronic pain: Secondary | ICD-10-CM | POA: Diagnosis not present

## 2019-10-23 DIAGNOSIS — M5442 Lumbago with sciatica, left side: Secondary | ICD-10-CM | POA: Diagnosis not present

## 2019-10-23 DIAGNOSIS — I4891 Unspecified atrial fibrillation: Secondary | ICD-10-CM | POA: Diagnosis not present
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
## 2019-10-29 DIAGNOSIS — B078 Other viral warts: Secondary | ICD-10-CM | POA: Diagnosis not present

## 2019-11-20 ENCOUNTER — Encounter
Payer: BC Managed Care – PPO | Attending: Physical Medicine & Rehabilitation | Admitting: Physical Medicine & Rehabilitation

## 2019-11-20 ENCOUNTER — Other Ambulatory Visit: Payer: Self-pay

## 2019-11-20 ENCOUNTER — Encounter: Payer: Self-pay | Admitting: Physical Medicine & Rehabilitation

## 2019-11-20 VITALS — BP 116/75 | HR 54 | Temp 97.7°F | Ht 73.0 in | Wt 214.0 lb

## 2019-11-20 DIAGNOSIS — M76892 Other specified enthesopathies of left lower limb, excluding foot: Secondary | ICD-10-CM | POA: Diagnosis not present

## 2019-11-20 DIAGNOSIS — M5442 Lumbago with sciatica, left side: Secondary | ICD-10-CM | POA: Insufficient documentation

## 2019-11-20 DIAGNOSIS — M791 Myalgia, unspecified site: Secondary | ICD-10-CM | POA: Diagnosis not present

## 2019-11-20 DIAGNOSIS — M47817 Spondylosis without myelopathy or radiculopathy, lumbosacral region: Secondary | ICD-10-CM

## 2019-11-20 DIAGNOSIS — K219 Gastro-esophageal reflux disease without esophagitis: Secondary | ICD-10-CM | POA: Insufficient documentation

## 2019-11-20 DIAGNOSIS — Z87891 Personal history of nicotine dependence: Secondary | ICD-10-CM | POA: Diagnosis not present

## 2019-11-20 DIAGNOSIS — I4891 Unspecified atrial fibrillation: Secondary | ICD-10-CM | POA: Diagnosis not present

## 2019-11-20 DIAGNOSIS — G8929 Other chronic pain: Secondary | ICD-10-CM | POA: Insufficient documentation

## 2019-11-20 DIAGNOSIS — M25562 Pain in left knee: Secondary | ICD-10-CM | POA: Insufficient documentation

## 2019-11-20 DIAGNOSIS — E785 Hyperlipidemia, unspecified: Secondary | ICD-10-CM | POA: Insufficient documentation

## 2019-11-20 DIAGNOSIS — Z9889 Other specified postprocedural states: Secondary | ICD-10-CM | POA: Diagnosis not present

## 2019-11-20 DIAGNOSIS — M47816 Spondylosis without myelopathy or radiculopathy, lumbar region: Secondary | ICD-10-CM | POA: Diagnosis not present

## 2019-11-20 DIAGNOSIS — M79644 Pain in right finger(s): Secondary | ICD-10-CM

## 2019-11-20 MED ORDER — DICLOFENAC SODIUM 1 % EX GEL: CUTANEOUS | 1 refills | Status: DC
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
## 2019-11-22 DIAGNOSIS — B070 Plantar wart: Secondary | ICD-10-CM | POA: Diagnosis not present

## 2020-01-01 ENCOUNTER — Encounter
Payer: BC Managed Care – PPO | Attending: Physical Medicine & Rehabilitation | Admitting: Physical Medicine & Rehabilitation

## 2020-01-01 ENCOUNTER — Encounter: Payer: Self-pay | Admitting: Physical Medicine & Rehabilitation

## 2020-01-01 ENCOUNTER — Other Ambulatory Visit: Payer: Self-pay

## 2020-01-01 VITALS — BP 124/85 | HR 59 | Temp 97.7°F | Ht 73.0 in | Wt 221.6 lb

## 2020-01-01 DIAGNOSIS — M47816 Spondylosis without myelopathy or radiculopathy, lumbar region: Secondary | ICD-10-CM

## 2020-01-01 DIAGNOSIS — M25562 Pain in left knee: Secondary | ICD-10-CM | POA: Diagnosis not present

## 2020-01-01 DIAGNOSIS — M47817 Spondylosis without myelopathy or radiculopathy, lumbosacral region: Secondary | ICD-10-CM | POA: Diagnosis not present

## 2020-01-01 DIAGNOSIS — M76892 Other specified enthesopathies of left lower limb, excluding foot: Secondary | ICD-10-CM

## 2020-01-01 DIAGNOSIS — I4891 Unspecified atrial fibrillation: Secondary | ICD-10-CM | POA: Diagnosis not present

## 2020-01-01 DIAGNOSIS — K219 Gastro-esophageal reflux disease without esophagitis: Secondary | ICD-10-CM | POA: Insufficient documentation

## 2020-01-01 DIAGNOSIS — E785 Hyperlipidemia, unspecified: Secondary | ICD-10-CM | POA: Diagnosis not present

## 2020-01-01 DIAGNOSIS — M791 Myalgia, unspecified site: Secondary | ICD-10-CM | POA: Insufficient documentation

## 2020-01-01 DIAGNOSIS — Z87891 Personal history of nicotine dependence: Secondary | ICD-10-CM | POA: Insufficient documentation

## 2020-01-01 DIAGNOSIS — M5442 Lumbago with sciatica, left side: Secondary | ICD-10-CM | POA: Insufficient documentation

## 2020-01-01 DIAGNOSIS — Z9889 Other specified postprocedural states: Secondary | ICD-10-CM | POA: Insufficient documentation

## 2020-01-01 DIAGNOSIS — G8929 Other chronic pain: Secondary | ICD-10-CM | POA: Diagnosis not present

## 2020-01-01 DIAGNOSIS — M79644 Pain in right finger(s): Secondary | ICD-10-CM
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
## 2020-01-23 ENCOUNTER — Encounter: Payer: Self-pay | Admitting: Family Medicine

## 2020-02-19 ENCOUNTER — Ambulatory Visit: Payer: BC Managed Care – PPO | Admitting: Physical Medicine & Rehabilitation

## 2020-02-21 ENCOUNTER — Encounter: Payer: Self-pay | Admitting: Physical Medicine & Rehabilitation

## 2020-02-21 ENCOUNTER — Other Ambulatory Visit: Payer: Self-pay

## 2020-02-21 ENCOUNTER — Encounter
Payer: BC Managed Care – PPO | Attending: Physical Medicine & Rehabilitation | Admitting: Physical Medicine & Rehabilitation

## 2020-02-21 VITALS — BP 119/81 | HR 68 | Temp 98.4°F | Ht 73.0 in | Wt 225.4 lb

## 2020-02-21 DIAGNOSIS — M25562 Pain in left knee: Secondary | ICD-10-CM | POA: Diagnosis not present

## 2020-02-21 DIAGNOSIS — M5442 Lumbago with sciatica, left side: Secondary | ICD-10-CM | POA: Insufficient documentation

## 2020-02-21 DIAGNOSIS — M47817 Spondylosis without myelopathy or radiculopathy, lumbosacral region: Secondary | ICD-10-CM

## 2020-02-21 DIAGNOSIS — M47816 Spondylosis without myelopathy or radiculopathy, lumbar region: Secondary | ICD-10-CM

## 2020-02-21 DIAGNOSIS — I4891 Unspecified atrial fibrillation: Secondary | ICD-10-CM | POA: Diagnosis not present

## 2020-02-21 DIAGNOSIS — M2170 Unequal limb length (acquired), unspecified site: Secondary | ICD-10-CM

## 2020-02-21 DIAGNOSIS — G8929 Other chronic pain: Secondary | ICD-10-CM | POA: Insufficient documentation

## 2020-02-21 DIAGNOSIS — M791 Myalgia, unspecified site: Secondary | ICD-10-CM | POA: Diagnosis not present

## 2020-02-21 DIAGNOSIS — Z9889 Other specified postprocedural states: Secondary | ICD-10-CM | POA: Insufficient documentation

## 2020-02-21 DIAGNOSIS — Z87891 Personal history of nicotine dependence: Secondary | ICD-10-CM | POA: Diagnosis not present

## 2020-02-21 DIAGNOSIS — M76892 Other specified enthesopathies of left lower limb, excluding foot: Secondary | ICD-10-CM

## 2020-02-21 DIAGNOSIS — K219 Gastro-esophageal reflux disease without esophagitis: Secondary | ICD-10-CM | POA: Insufficient documentation

## 2020-02-21 DIAGNOSIS — E785 Hyperlipidemia, unspecified: Secondary | ICD-10-CM | POA: Diagnosis not present
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
## 2020-03-21 ENCOUNTER — Encounter
Payer: BC Managed Care – PPO | Attending: Physical Medicine & Rehabilitation | Admitting: Physical Medicine & Rehabilitation

## 2020-03-21 ENCOUNTER — Other Ambulatory Visit: Payer: Self-pay

## 2020-03-21 ENCOUNTER — Encounter: Payer: Self-pay | Admitting: Physical Medicine & Rehabilitation

## 2020-03-21 VITALS — BP 120/73 | HR 54 | Temp 98.4°F | Ht 73.0 in | Wt 224.2 lb

## 2020-03-21 DIAGNOSIS — I4891 Unspecified atrial fibrillation: Secondary | ICD-10-CM | POA: Diagnosis not present

## 2020-03-21 DIAGNOSIS — M47817 Spondylosis without myelopathy or radiculopathy, lumbosacral region: Secondary | ICD-10-CM | POA: Diagnosis not present

## 2020-03-21 DIAGNOSIS — Z87891 Personal history of nicotine dependence: Secondary | ICD-10-CM | POA: Diagnosis not present

## 2020-03-21 DIAGNOSIS — E785 Hyperlipidemia, unspecified: Secondary | ICD-10-CM | POA: Diagnosis not present

## 2020-03-21 DIAGNOSIS — Z9889 Other specified postprocedural states: Secondary | ICD-10-CM | POA: Diagnosis not present

## 2020-03-21 DIAGNOSIS — M791 Myalgia, unspecified site: Secondary | ICD-10-CM | POA: Diagnosis not present

## 2020-03-21 DIAGNOSIS — M5442 Lumbago with sciatica, left side: Secondary | ICD-10-CM | POA: Diagnosis not present

## 2020-03-21 DIAGNOSIS — M47816 Spondylosis without myelopathy or radiculopathy, lumbar region: Secondary | ICD-10-CM | POA: Diagnosis not present

## 2020-03-21 DIAGNOSIS — K219 Gastro-esophageal reflux disease without esophagitis: Secondary | ICD-10-CM | POA: Diagnosis not present

## 2020-03-21 DIAGNOSIS — M25562 Pain in left knee: Secondary | ICD-10-CM | POA: Diagnosis not present

## 2020-03-21 DIAGNOSIS — G8929 Other chronic pain: Secondary | ICD-10-CM | POA: Insufficient documentation

## 2020-03-21 NOTE — Patient Instructions (Signed)
You had a radio frequency procedure today This was done to alleviate joint pain in your lumbar area We injected lidocaine which is a local anesthetic.  You may experience soreness at the injection sites. You may also experienced some irritation of the nerves that were heated I'm recommending ice for 30 minutes every 2 hours as needed for the next 24-48 hours   

## 2020-03-21 NOTE — Progress Notes (Signed)
Right L2 and Right L3 medial branch radio frequency neurotomy under fluoroscopic guidance   Indication: Low back pain due to lumbar spondylosis which has been relieved on 2 occasions by greater than 50% by lumbar medial branch blocks at corresponding levels.  Informed consent was obtained after describing risks and benefits of the procedure with the patient, this includes bleeding, bruising, infection, paralysis and medication side effects. The patient wishes to proceed and has given written consent. The patient was placed in a prone position. The lumbar and sacral area was marked and prepped with Betadine. A 25-gauge 1-1/2 inch needle was inserted into the skin and subcutaneous tissue at 3 sites in one ML of 1% lidocaine was injected into each site. Then the Right L3 SAP/transverse process junction was targeted. Bone contact was made and confirmed with lateral imaging.  motor stimulation at 2 Hz confirm proper needle location followed by injection of 42ml 2% MPF lidocaine. Then the Right L4 SAP/transverse process junction was targeted. Bone contact was made and confirmed with lateral imaging. motor stimulation at 2 Hz confirm proper needle location followed by injection of 77ml 2% MPF lidocaine. Radio frequency lesion being at [facility] for 90 seconds was performed. Needles were removed. Post procedure instructions and vital signs were performed. Patient tolerated procedure well. Followup appointment was given.

## 2020-03-21 NOTE — Progress Notes (Signed)
  PROCEDURE RECORD [facility]   Name: George Singleton DOB:01-14-1981 MRN: [id number]  Date:03/21/2020  Physician: Alysia Penna, MD    Nurse/CMA: Jorja Loa MA  Allergies: No Known Allergies  Consent Signed: Yes.    Is patient diabetic? No.  CBG today? N/A  Pregnant: No. LMP: No LMP for male patient. (age 41-55)  Anticoagulants: no Anti-inflammatory: no Antibiotics: no  Procedure: Right Lumbar RFA   Position: Supine Start Time: 3:40 End Time: 3:55PM Fluoro Time:42  RN/CMA Tene Gato MA Stepehn Eckard MA    Time 3:22 4:00    BP 120/73 126/84    Pulse 53 50    Respirations 14 14    O2 Sat 96 96    S/S 6 6    Pain Level 3/10 5/10     D/C home with  SELF patient A & O X 3, D/C instructions reviewed, and sits independently.

## 2020-03-23 DIAGNOSIS — R002 Palpitations: Secondary | ICD-10-CM | POA: Insufficient documentation

## 2020-03-26 ENCOUNTER — Ambulatory Visit: Payer: BC Managed Care – PPO | Admitting: Internal Medicine

## 2020-03-26 ENCOUNTER — Other Ambulatory Visit: Payer: Self-pay

## 2020-03-26 ENCOUNTER — Encounter: Payer: Self-pay | Admitting: Internal Medicine

## 2020-03-26 VITALS — BP 112/72 | HR 48 | Ht 73.0 in | Wt 225.0 lb

## 2020-03-26 DIAGNOSIS — Z8679 Personal history of other diseases of the circulatory system: Secondary | ICD-10-CM

## 2020-03-26 DIAGNOSIS — R002 Palpitations: Secondary | ICD-10-CM

## 2020-03-26 DIAGNOSIS — Z9889 Other specified postprocedural states: Secondary | ICD-10-CM | POA: Diagnosis not present

## 2020-03-26 NOTE — Patient Instructions (Signed)
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
## 2020-04-04 ENCOUNTER — Ambulatory Visit: Payer: BC Managed Care – PPO | Admitting: Physical Medicine & Rehabilitation

## 2020-04-10 ENCOUNTER — Encounter
Payer: BC Managed Care – PPO | Attending: Physical Medicine & Rehabilitation | Admitting: Physical Medicine & Rehabilitation

## 2020-04-10 ENCOUNTER — Other Ambulatory Visit: Payer: Self-pay

## 2020-04-10 ENCOUNTER — Encounter: Payer: Self-pay | Admitting: Physical Medicine & Rehabilitation

## 2020-04-10 VITALS — BP 123/79 | HR 53 | Temp 98.4°F | Ht 73.0 in | Wt 217.8 lb

## 2020-04-10 DIAGNOSIS — I4891 Unspecified atrial fibrillation: Secondary | ICD-10-CM | POA: Insufficient documentation

## 2020-04-10 DIAGNOSIS — G8929 Other chronic pain: Secondary | ICD-10-CM | POA: Diagnosis not present

## 2020-04-10 DIAGNOSIS — M25562 Pain in left knee: Secondary | ICD-10-CM | POA: Insufficient documentation

## 2020-04-10 DIAGNOSIS — M791 Myalgia, unspecified site: Secondary | ICD-10-CM | POA: Insufficient documentation

## 2020-04-10 DIAGNOSIS — M47817 Spondylosis without myelopathy or radiculopathy, lumbosacral region: Secondary | ICD-10-CM | POA: Diagnosis not present

## 2020-04-10 DIAGNOSIS — M5442 Lumbago with sciatica, left side: Secondary | ICD-10-CM | POA: Diagnosis not present

## 2020-04-10 DIAGNOSIS — M25551 Pain in right hip: Secondary | ICD-10-CM

## 2020-04-10 DIAGNOSIS — M47816 Spondylosis without myelopathy or radiculopathy, lumbar region: Secondary | ICD-10-CM

## 2020-04-10 DIAGNOSIS — Z87891 Personal history of nicotine dependence: Secondary | ICD-10-CM | POA: Diagnosis not present

## 2020-04-10 DIAGNOSIS — Z9889 Other specified postprocedural states: Secondary | ICD-10-CM | POA: Insufficient documentation

## 2020-04-10 DIAGNOSIS — E785 Hyperlipidemia, unspecified: Secondary | ICD-10-CM | POA: Diagnosis not present

## 2020-04-10 DIAGNOSIS — M5417 Radiculopathy, lumbosacral region: Secondary | ICD-10-CM

## 2020-04-10 DIAGNOSIS — K219 Gastro-esophageal reflux disease without esophagitis: Secondary | ICD-10-CM | POA: Insufficient documentation

## 2020-04-10 NOTE — Addendum Note (Signed)
Addended by: Delice Lesch A on: 04/10/2020 02:38 PM   Modules accepted: Orders
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
## 2020-05-22 ENCOUNTER — Other Ambulatory Visit: Payer: Self-pay

## 2020-05-22 ENCOUNTER — Encounter
Payer: BC Managed Care – PPO | Attending: Physical Medicine & Rehabilitation | Admitting: Physical Medicine & Rehabilitation

## 2020-05-22 ENCOUNTER — Encounter: Payer: Self-pay | Admitting: Physical Medicine & Rehabilitation

## 2020-05-22 VITALS — BP 118/79 | HR 64 | Temp 98.5°F | Ht 73.0 in | Wt 203.4 lb

## 2020-05-22 DIAGNOSIS — M25562 Pain in left knee: Secondary | ICD-10-CM | POA: Insufficient documentation

## 2020-05-22 DIAGNOSIS — M47816 Spondylosis without myelopathy or radiculopathy, lumbar region: Secondary | ICD-10-CM

## 2020-05-22 DIAGNOSIS — G8929 Other chronic pain: Secondary | ICD-10-CM | POA: Diagnosis not present

## 2020-05-22 DIAGNOSIS — M791 Myalgia, unspecified site: Secondary | ICD-10-CM

## 2020-05-22 DIAGNOSIS — M25561 Pain in right knee: Secondary | ICD-10-CM | POA: Diagnosis not present

## 2020-05-22 DIAGNOSIS — M25551 Pain in right hip: Secondary | ICD-10-CM | POA: Diagnosis not present

## 2020-05-22 DIAGNOSIS — M47817 Spondylosis without myelopathy or radiculopathy, lumbosacral region: Secondary | ICD-10-CM
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
## 2020-05-30 ENCOUNTER — Ambulatory Visit (INDEPENDENT_AMBULATORY_CARE_PROVIDER_SITE_OTHER): Payer: BC Managed Care – PPO | Admitting: Family Medicine

## 2020-05-30 ENCOUNTER — Encounter: Payer: Self-pay | Admitting: Family Medicine

## 2020-05-30 ENCOUNTER — Telehealth: Payer: Self-pay | Admitting: *Deleted

## 2020-05-30 ENCOUNTER — Other Ambulatory Visit: Payer: Self-pay

## 2020-05-30 VITALS — BP 98/64 | HR 53 | Temp 98.2°F | Ht 73.0 in | Wt 205.6 lb

## 2020-05-30 DIAGNOSIS — R5383 Other fatigue: Secondary | ICD-10-CM | POA: Diagnosis not present

## 2020-05-30 DIAGNOSIS — M2550 Pain in unspecified joint: Secondary | ICD-10-CM

## 2020-05-30 DIAGNOSIS — Z0000 Encounter for general adult medical examination without abnormal findings: Secondary | ICD-10-CM

## 2020-05-30 DIAGNOSIS — M5442 Lumbago with sciatica, left side: Secondary | ICD-10-CM | POA: Diagnosis not present

## 2020-05-30 DIAGNOSIS — E785 Hyperlipidemia, unspecified: Secondary | ICD-10-CM | POA: Diagnosis not present

## 2020-05-30 DIAGNOSIS — G8929 Other chronic pain: Secondary | ICD-10-CM

## 2020-05-30 NOTE — Telephone Encounter (Signed)
Patient states he forgot to let Dr Ethlyn Gallery know during his physical a problem he had.  Patient stated he had a RF ablation to the right side in August, in which the back doctor had a hard time with the needles and had to repeat the numbing.  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  Stated he mentioned this to his back doctor and was told this was not related to the ablation.  Message sent to PCP.

## 2020-05-30 NOTE — Patient Instructions (Signed)
George Singleton ([employer] [place] [employer]).
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
## 2020-06-03 LAB — CBC WITH DIFFERENTIAL/PLATELET
Absolute Monocytes: 427 cells/uL (ref 200–950)
Basophils Absolute: 49 cells/uL (ref 0–200)
Basophils Relative: 0.8 %
Eosinophils Absolute: 207 cells/uL (ref 15–500)
Eosinophils Relative: 3.4 %
HCT: 43.6 % (ref 38.5–50.0)
Hemoglobin: 14.6 g/dL (ref 13.2–17.1)
Lymphs Abs: 2190 cells/uL (ref 850–3900)
MCH: 30.4 pg (ref 27.0–33.0)
MCHC: 33.5 g/dL (ref 32.0–36.0)
MCV: 90.8 fL (ref 80.0–100.0)
MPV: 10.7 fL (ref 7.5–12.5)
Monocytes Relative: 7 %
Neutro Abs: 3227 cells/uL (ref 1500–7800)
Neutrophils Relative %: 52.9 %
Platelets: 203 10*3/uL (ref 140–400)
RBC: 4.8 10*6/uL (ref 4.20–5.80)
RDW: 12.9 % (ref 11.0–15.0)
Total Lymphocyte: 35.9 %
WBC: 6.1 10*3/uL (ref 3.8–10.8)

## 2020-06-03 LAB — LIPID PANEL
Cholesterol: 187 mg/dL (ref <200)
HDL: 37 mg/dL — ABNORMAL LOW (ref >=40)
LDL Cholesterol (Calc): 135 mg/dL (calc) — ABNORMAL HIGH
Non-HDL Cholesterol (Calc): 150 mg/dL (calc) — ABNORMAL HIGH (ref <130)
Total CHOL/HDL Ratio: 5.1 (calc) — ABNORMAL HIGH (ref <5.0)
Triglycerides: 62 mg/dL (ref <150)

## 2020-06-03 LAB — COMPREHENSIVE METABOLIC PANEL
AG Ratio: 2.2 (calc) (ref 1.0–2.5)
ALT: 14 U/L (ref 9–46)
AST: 17 U/L (ref 10–40)
Albumin: 4.6 g/dL (ref 3.6–5.1)
Alkaline phosphatase (APISO): 43 U/L (ref 36–130)
BUN: 16 mg/dL (ref 7–25)
CO2: 26 mmol/L (ref 20–32)
Calcium: 9.6 mg/dL (ref 8.6–10.3)
Chloride: 107 mmol/L (ref 98–110)
Creat: 1.05 mg/dL (ref 0.60–1.35)
Globulin: 2.1 g/dL (calc) (ref 1.9–3.7)
Glucose, Bld: 76 mg/dL (ref 65–99)
Potassium: 4.4 mmol/L (ref 3.5–5.3)
Sodium: 142 mmol/L (ref 135–146)
Total Bilirubin: 0.4 mg/dL (ref 0.2–1.2)
Total Protein: 6.7 g/dL (ref 6.1–8.1)

## 2020-06-03 LAB — TSH: TSH: 1.76 mIU/L (ref 0.40–4.50)

## 2020-06-03 LAB — C-REACTIVE PROTEIN: CRP: 0.4 mg/L (ref <8.0)

## 2020-06-03 LAB — VITAMIN B12: Vitamin B-12: 455 pg/mL (ref 200–1100)

## 2020-06-03 LAB — RHEUMATOID FACTOR: Rheumatoid fact SerPl-aCnc: <14 IU/mL (ref <14)

## 2020-06-03 LAB — SEDIMENTATION RATE: Sed Rate: 2 mm/h (ref 0–15)

## 2020-06-03 LAB — VITAMIN D 25 HYDROXY (VIT D DEFICIENCY, FRACTURES): Vit D, 25-Hydroxy: 50 ng/mL (ref 30–100)

## 2020-06-03 LAB — TESTOSTERONE: Testosterone: 443 ng/dL (ref 250–827)

## 2020-06-03 LAB — ANA: Anti Nuclear Antibody (ANA): NEGATIVE

## 2020-06-03 LAB — HLA-B27 ANTIGEN: HLA-B27 Antigen: NEGATIVE

## 2020-06-09 NOTE — Telephone Encounter (Signed)
Noted  

## 2020-07-21 ENCOUNTER — Encounter: Payer: Self-pay | Admitting: Physical Medicine & Rehabilitation

## 2020-07-21 ENCOUNTER — Other Ambulatory Visit: Payer: Self-pay

## 2020-07-21 ENCOUNTER — Encounter
Payer: BC Managed Care – PPO | Attending: Physical Medicine & Rehabilitation | Admitting: Physical Medicine & Rehabilitation

## 2020-07-21 VITALS — BP 118/76 | HR 60 | Temp 98.3°F | Ht 73.0 in | Wt 220.0 lb

## 2020-07-21 DIAGNOSIS — G8929 Other chronic pain: Secondary | ICD-10-CM

## 2020-07-21 DIAGNOSIS — M25562 Pain in left knee: Secondary | ICD-10-CM | POA: Diagnosis not present

## 2020-07-21 DIAGNOSIS — G5701 Lesion of sciatic nerve, right lower limb: Secondary | ICD-10-CM | POA: Diagnosis not present

## 2020-07-21 DIAGNOSIS — M47816 Spondylosis without myelopathy or radiculopathy, lumbar region: Secondary | ICD-10-CM | POA: Diagnosis not present

## 2020-07-21 DIAGNOSIS — M791 Myalgia, unspecified site: Secondary | ICD-10-CM

## 2020-07-21 DIAGNOSIS — M47817 Spondylosis without myelopathy or radiculopathy, lumbosacral region: Secondary | ICD-10-CM
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
## 2020-07-21 NOTE — Patient Instructions (Signed)
[email or web address]

## 2020-07-22 DIAGNOSIS — D1801 Hemangioma of skin and subcutaneous tissue: Secondary | ICD-10-CM | POA: Diagnosis not present

## 2020-07-22 DIAGNOSIS — D2272 Melanocytic nevi of left lower limb, including hip: Secondary | ICD-10-CM | POA: Diagnosis not present

## 2020-07-22 DIAGNOSIS — L814 Other melanin hyperpigmentation: Secondary | ICD-10-CM | POA: Diagnosis not present

## 2020-07-22 DIAGNOSIS — D485 Neoplasm of uncertain behavior of skin: Secondary | ICD-10-CM | POA: Diagnosis not present

## 2020-07-22 DIAGNOSIS — L578 Other skin changes due to chronic exposure to nonionizing radiation: Secondary | ICD-10-CM | POA: Diagnosis not present

## 2020-07-22 DIAGNOSIS — D2239 Melanocytic nevi of other parts of face: Secondary | ICD-10-CM | POA: Diagnosis not present

## 2020-09-01 ENCOUNTER — Other Ambulatory Visit: Payer: Self-pay | Admitting: Physical Medicine & Rehabilitation

## 2020-09-13 ENCOUNTER — Other Ambulatory Visit: Payer: Self-pay | Admitting: Family Medicine

## 2020-09-13 DIAGNOSIS — K219 Gastro-esophageal reflux disease without esophagitis: Secondary | ICD-10-CM

## 2020-09-25 ENCOUNTER — Ambulatory Visit: Payer: BC Managed Care – PPO | Admitting: Physical Medicine & Rehabilitation

## 2020-09-26 ENCOUNTER — Other Ambulatory Visit: Payer: Self-pay | Admitting: Family Medicine

## 2020-09-26 ENCOUNTER — Telehealth: Payer: Self-pay | Admitting: Family Medicine

## 2020-09-26 MED ORDER — PANTOPRAZOLE SODIUM 40 MG PO TBEC: 40.0000 mg | DELAYED_RELEASE_TABLET | Freq: Every day | ORAL | 0 refills | Status: DC

## 2020-09-26 NOTE — Telephone Encounter (Signed)
Pt is calling in stating that his insurance does not cover Rx omeprazole (PRILOSEC) 40 MG but he was told by the pharmacist that he could get a OTC that is 20 MG and take two, but pt would like to see what Dr. Berenice Bouton take on this.  Pt would like to have a call back.

## 2020-09-26 NOTE — Telephone Encounter (Signed)
Patient informed of the message below.  Patient stated he would prefer to try Protonix and is aware the Rx was sent to [facility].

## 2020-09-26 NOTE — Telephone Encounter (Signed)
That would be fine for him to do. Alternative would be switching medications (ie protonix 40mg ) although in my system I can't see a cost difference with these. But, we could try to send 90 day supply of that to see if it is covered better if he would like. Buying otc prilosec would add up esp if doubling up dose like that.

## 2020-09-26 NOTE — Addendum Note (Signed)
Addended by: Agnes Lawrence on: 09/26/2020 03:28 PM   Modules accepted: Orders

## 2020-09-29 ENCOUNTER — Encounter
Payer: BC Managed Care – PPO | Attending: Physical Medicine & Rehabilitation | Admitting: Physical Medicine & Rehabilitation

## 2020-09-29 ENCOUNTER — Telehealth: Payer: Self-pay | Admitting: Family Medicine

## 2020-09-29 ENCOUNTER — Other Ambulatory Visit: Payer: Self-pay

## 2020-09-29 ENCOUNTER — Encounter: Payer: Self-pay | Admitting: Physical Medicine & Rehabilitation

## 2020-09-29 VITALS — BP 111/7 | HR 46 | Temp 98.1°F | Ht 73.0 in | Wt 189.0 lb

## 2020-09-29 DIAGNOSIS — M25562 Pain in left knee: Secondary | ICD-10-CM | POA: Insufficient documentation

## 2020-09-29 DIAGNOSIS — M533 Sacrococcygeal disorders, not elsewhere classified: Secondary | ICD-10-CM | POA: Insufficient documentation

## 2020-09-29 DIAGNOSIS — G8929 Other chronic pain: Secondary | ICD-10-CM | POA: Insufficient documentation

## 2020-09-29 DIAGNOSIS — M5417 Radiculopathy, lumbosacral region: Secondary | ICD-10-CM | POA: Insufficient documentation

## 2020-09-29 DIAGNOSIS — M47816 Spondylosis without myelopathy or radiculopathy, lumbar region: Secondary | ICD-10-CM | POA: Insufficient documentation

## 2020-09-29 DIAGNOSIS — M25551 Pain in right hip: Secondary | ICD-10-CM | POA: Insufficient documentation
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
## 2020-09-29 NOTE — Telephone Encounter (Signed)
Patient states a prior authorization is needed for Pantoprazole.  Please advise.

## 2020-10-02 NOTE — Telephone Encounter (Signed)
Prior auth sent to [email or web address]-Key: [id number].

## 2020-10-08 NOTE — Telephone Encounter (Signed)
Per his insurance/denial - he needs to try another medication first that is OTC. They suggested nexium. I would suggest he purchase 1 month of nexium and let me know how this does. If this is not enough to control symptoms, then we can document that and insurance should cover the pantoprozole.

## 2020-10-08 NOTE — Telephone Encounter (Signed)
Patient informed of the message below.

## 2020-10-08 NOTE — Telephone Encounter (Signed)
Fax received stating request was denied.  Message sent to PCP.

## 2020-11-20 ENCOUNTER — Telehealth: Payer: Self-pay | Admitting: Internal Medicine

## 2020-11-20 NOTE — Telephone Encounter (Signed)
Sudafed as well as Dayquil/Nyquil have stimulants in them which can cause his heart to race especially in a patients with A Fib.  Recommend he discontinue and if allergy symptoms persist he can try OTC zyrtec.  If palpitations continue or if "skipping beats" continues, may need to report to ER

## 2020-11-20 NOTE — Telephone Encounter (Signed)
Spoke with pt and advised per pharmacy discontinue use of Sudafed, Nyquil and Dayquil.  Continue current Zyrtec and Flonase.  If cold/allergy symptoms increase follow up with PCP for further recommendations. Encouraged no caffeine.  Continue to monitor HR and if palpitations resurface contact office. Pt due to see Dr Caryl Comes 08/22. Reiterated ED precautions.  Pt verbalizes understanding and thanked Therapist, sports for call.

## 2020-11-20 NOTE — Telephone Encounter (Signed)
Spoke with pt with history of palpitations and s/p Afib ablation 2011 and 2014.  Pt complaining of feelings of heart racing, lightheadedness, SOB and" heart skipping beats" off and on yesterday and the day before.  Pt denies symptoms any today.  Pt states he has been away from home traveling and has also been struggling with cold/allergy symptoms for which he has been taking a combination of Sudafed, Nyquil and Dayquil for the last several days.  Pt advised to hold OTC medications until RN can have pharmacy team review.  Will forward information to PharmD team for review and recommendation.  Pt advised Dr Caryl Comes is out of the office for the next 3 weeks.  Reviewed ED precautions.  Pt verbalizes understanding and agrees with current plan.

## 2020-11-20 NOTE — Telephone Encounter (Signed)
Patient c/o Palpitations:  High priority if patient c/o lightheadedness, shortness of breath, or chest pain  1) How long have you had palpitations/irregular HR/ Afib? Are you having the symptoms now? Last 2 or 3 days; No  2) Are you currently experiencing lightheadedness, SOB or CP? No  3) Do you have a history of afib (atrial fibrillation) or irregular heart rhythm? Yes  4) Have you checked your BP or HR? (document readings if available): No  5) Are you experiencing any other symptoms? Lightheadedness, experienced faint spells, SOB

## 2020-11-21 ENCOUNTER — Encounter: Payer: Self-pay | Admitting: Family Medicine

## 2020-12-25 ENCOUNTER — Ambulatory Visit: Payer: BC Managed Care – PPO | Admitting: Physical Medicine & Rehabilitation

## 2020-12-30 ENCOUNTER — Encounter: Payer: Self-pay | Admitting: Registered Nurse

## 2020-12-30 ENCOUNTER — Ambulatory Visit: Payer: BC Managed Care – PPO | Admitting: Registered Nurse

## 2020-12-30 ENCOUNTER — Other Ambulatory Visit: Payer: Self-pay

## 2020-12-30 ENCOUNTER — Encounter: Payer: BC Managed Care – PPO | Attending: Registered Nurse | Admitting: Registered Nurse

## 2020-12-30 VITALS — BP 113/73 | HR 56 | Temp 98.2°F | Ht 73.0 in | Wt 195.6 lb

## 2020-12-30 DIAGNOSIS — G894 Chronic pain syndrome: Secondary | ICD-10-CM | POA: Diagnosis not present

## 2020-12-30 DIAGNOSIS — M5417 Radiculopathy, lumbosacral region: Secondary | ICD-10-CM | POA: Insufficient documentation

## 2020-12-30 DIAGNOSIS — G5701 Lesion of sciatic nerve, right lower limb: Secondary | ICD-10-CM | POA: Insufficient documentation

## 2020-12-30 DIAGNOSIS — M791 Myalgia, unspecified site: Secondary | ICD-10-CM | POA: Insufficient documentation

## 2020-12-30 DIAGNOSIS — R001 Bradycardia, unspecified: Secondary | ICD-10-CM | POA: Diagnosis not present

## 2020-12-30 DIAGNOSIS — M47816 Spondylosis without myelopathy or radiculopathy, lumbar region: Secondary | ICD-10-CM | POA: Insufficient documentation
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
## 2021-01-05 DIAGNOSIS — J019 Acute sinusitis, unspecified: Secondary | ICD-10-CM | POA: Diagnosis not present

## 2021-01-06 ENCOUNTER — Telehealth: Payer: BC Managed Care – PPO | Admitting: Family Medicine

## 2021-02-11 ENCOUNTER — Other Ambulatory Visit: Payer: Self-pay | Admitting: Physical Medicine & Rehabilitation

## 2021-02-11 DIAGNOSIS — L700 Acne vulgaris: Secondary | ICD-10-CM | POA: Diagnosis not present

## 2021-02-11 DIAGNOSIS — D2272 Melanocytic nevi of left lower limb, including hip: Secondary | ICD-10-CM | POA: Diagnosis not present

## 2021-02-27 DIAGNOSIS — I4891 Unspecified atrial fibrillation: Secondary | ICD-10-CM | POA: Diagnosis not present

## 2021-02-27 DIAGNOSIS — M5432 Sciatica, left side: Secondary | ICD-10-CM | POA: Diagnosis not present

## 2021-02-27 DIAGNOSIS — G8929 Other chronic pain: Secondary | ICD-10-CM | POA: Diagnosis not present

## 2021-02-27 DIAGNOSIS — M549 Dorsalgia, unspecified: Secondary | ICD-10-CM | POA: Diagnosis not present

## 2021-02-27 DIAGNOSIS — L988 Other specified disorders of the skin and subcutaneous tissue: Secondary | ICD-10-CM | POA: Diagnosis not present

## 2021-02-27 DIAGNOSIS — M545 Low back pain, unspecified: Secondary | ICD-10-CM | POA: Diagnosis not present

## 2021-02-27 DIAGNOSIS — R234 Changes in skin texture: Secondary | ICD-10-CM | POA: Diagnosis not present

## 2021-03-02 DIAGNOSIS — J309 Allergic rhinitis, unspecified: Secondary | ICD-10-CM | POA: Diagnosis not present

## 2021-03-02 DIAGNOSIS — H1045 Other chronic allergic conjunctivitis: Secondary | ICD-10-CM | POA: Diagnosis not present

## 2021-03-02 DIAGNOSIS — J3089 Other allergic rhinitis: Secondary | ICD-10-CM | POA: Diagnosis not present

## 2021-03-19 ENCOUNTER — Encounter: Payer: BC Managed Care – PPO | Attending: Registered Nurse | Admitting: Physical Medicine & Rehabilitation

## 2021-03-19 ENCOUNTER — Encounter: Payer: Self-pay | Admitting: Physical Medicine & Rehabilitation

## 2021-03-19 ENCOUNTER — Other Ambulatory Visit: Payer: Self-pay

## 2021-03-19 VITALS — BP 118/75 | HR 52 | Temp 98.6°F | Ht 73.0 in | Wt 197.8 lb

## 2021-03-19 DIAGNOSIS — M5442 Lumbago with sciatica, left side: Secondary | ICD-10-CM | POA: Insufficient documentation

## 2021-03-19 DIAGNOSIS — G8929 Other chronic pain: Secondary | ICD-10-CM | POA: Insufficient documentation

## 2021-03-19 DIAGNOSIS — G894 Chronic pain syndrome: Secondary | ICD-10-CM | POA: Insufficient documentation

## 2021-03-19 DIAGNOSIS — M25562 Pain in left knee: Secondary | ICD-10-CM | POA: Insufficient documentation

## 2021-03-19 DIAGNOSIS — M5417 Radiculopathy, lumbosacral region: Secondary | ICD-10-CM | POA: Insufficient documentation

## 2021-03-19 DIAGNOSIS — M47816 Spondylosis without myelopathy or radiculopathy, lumbar region: Secondary | ICD-10-CM | POA: Diagnosis not present

## 2021-03-19 DIAGNOSIS — G5701 Lesion of sciatic nerve, right lower limb: Secondary | ICD-10-CM | POA: Insufficient documentation

## 2021-03-19 MED ORDER — BACLOFEN 10 MG PO TABS: 20.0000 mg | ORAL_TABLET | Freq: Every day | ORAL | 5 refills | Status: AC | PRN

## 2021-03-19 MED ORDER — DICLOFENAC SODIUM 1 % EX GEL: CUTANEOUS | 5 refills | Status: DC
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
## 2021-04-21 DIAGNOSIS — J343 Hypertrophy of nasal turbinates: Secondary | ICD-10-CM | POA: Diagnosis not present

## 2021-04-21 DIAGNOSIS — J342 Deviated nasal septum: Secondary | ICD-10-CM | POA: Diagnosis not present

## 2021-04-21 DIAGNOSIS — J324 Chronic pansinusitis: Secondary | ICD-10-CM | POA: Diagnosis not present

## 2021-04-21 DIAGNOSIS — J310 Chronic rhinitis: Secondary | ICD-10-CM | POA: Diagnosis not present

## 2021-04-30 ENCOUNTER — Other Ambulatory Visit: Payer: Self-pay

## 2021-04-30 ENCOUNTER — Encounter: Payer: Self-pay | Admitting: Internal Medicine

## 2021-04-30 ENCOUNTER — Ambulatory Visit: Payer: BC Managed Care – PPO | Admitting: Internal Medicine

## 2021-04-30 VITALS — BP 110/68 | HR 48 | Ht 73.0 in | Wt 198.6 lb

## 2021-04-30 DIAGNOSIS — R002 Palpitations: Secondary | ICD-10-CM

## 2021-04-30 DIAGNOSIS — Z8679 Personal history of other diseases of the circulatory system: Secondary | ICD-10-CM

## 2021-04-30 DIAGNOSIS — Z9889 Other specified postprocedural states: Secondary | ICD-10-CM

## 2021-04-30 NOTE — Patient Instructions (Signed)
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
## 2021-05-03 ENCOUNTER — Other Ambulatory Visit: Payer: Self-pay | Admitting: Physical Medicine & Rehabilitation

## 2021-05-13 ENCOUNTER — Other Ambulatory Visit: Payer: Self-pay | Admitting: Otolaryngology

## 2021-05-13 DIAGNOSIS — J329 Chronic sinusitis, unspecified: Secondary | ICD-10-CM

## 2021-06-02 ENCOUNTER — Ambulatory Visit
Admission: RE | Admit: 2021-06-02 | Discharge: 2021-06-02 | Disposition: A | Discharge status: Home / Self Care | Payer: BC Managed Care – PPO | Source: Ambulatory Visit | Attending: Otolaryngology | Admitting: Otolaryngology

## 2021-06-02 DIAGNOSIS — J329 Chronic sinusitis, unspecified: Secondary | ICD-10-CM

## 2021-06-23 DIAGNOSIS — J310 Chronic rhinitis: Secondary | ICD-10-CM | POA: Diagnosis not present

## 2021-06-23 DIAGNOSIS — R0982 Postnasal drip: Secondary | ICD-10-CM | POA: Diagnosis not present

## 2021-06-23 DIAGNOSIS — J343 Hypertrophy of nasal turbinates: Secondary | ICD-10-CM | POA: Diagnosis not present

## 2021-07-06 DIAGNOSIS — J3089 Other allergic rhinitis: Secondary | ICD-10-CM | POA: Diagnosis not present

## 2021-07-21 DIAGNOSIS — L814 Other melanin hyperpigmentation: Secondary | ICD-10-CM | POA: Diagnosis not present

## 2021-07-21 DIAGNOSIS — D1801 Hemangioma of skin and subcutaneous tissue: Secondary | ICD-10-CM | POA: Diagnosis not present

## 2021-07-21 DIAGNOSIS — L578 Other skin changes due to chronic exposure to nonionizing radiation: Secondary | ICD-10-CM | POA: Diagnosis not present

## 2021-07-21 DIAGNOSIS — D485 Neoplasm of uncertain behavior of skin: Secondary | ICD-10-CM | POA: Diagnosis not present

## 2021-07-21 DIAGNOSIS — D2262 Melanocytic nevi of left upper limb, including shoulder: Secondary | ICD-10-CM | POA: Diagnosis not present

## 2021-07-21 DIAGNOSIS — D229 Melanocytic nevi, unspecified: Secondary | ICD-10-CM | POA: Diagnosis not present

## 2021-08-24 DIAGNOSIS — D239 Other benign neoplasm of skin, unspecified: Secondary | ICD-10-CM | POA: Diagnosis not present

## 2021-09-09 DIAGNOSIS — F4323 Adjustment disorder with mixed anxiety and depressed mood: Secondary | ICD-10-CM | POA: Diagnosis not present

## 2021-09-23 DIAGNOSIS — F4323 Adjustment disorder with mixed anxiety and depressed mood: Secondary | ICD-10-CM | POA: Diagnosis not present

## 2021-10-06 DIAGNOSIS — F4323 Adjustment disorder with mixed anxiety and depressed mood: Secondary | ICD-10-CM | POA: Diagnosis not present

## 2021-10-20 DIAGNOSIS — F4323 Adjustment disorder with mixed anxiety and depressed mood: Secondary | ICD-10-CM | POA: Diagnosis not present

## 2021-11-03 DIAGNOSIS — F4323 Adjustment disorder with mixed anxiety and depressed mood: Secondary | ICD-10-CM | POA: Diagnosis not present

## 2021-11-24 DIAGNOSIS — F4323 Adjustment disorder with mixed anxiety and depressed mood: Secondary | ICD-10-CM | POA: Diagnosis not present

## 2021-12-10 DIAGNOSIS — F4323 Adjustment disorder with mixed anxiety and depressed mood: Secondary | ICD-10-CM | POA: Diagnosis not present

## 2022-01-04 DIAGNOSIS — H1045 Other chronic allergic conjunctivitis: Secondary | ICD-10-CM | POA: Diagnosis not present

## 2022-01-04 DIAGNOSIS — J3089 Other allergic rhinitis: Secondary | ICD-10-CM | POA: Diagnosis not present

## 2022-01-07 ENCOUNTER — Encounter: Payer: BC Managed Care – PPO | Admitting: Family

## 2022-01-15 ENCOUNTER — Ambulatory Visit (INDEPENDENT_AMBULATORY_CARE_PROVIDER_SITE_OTHER)
Admission: RE | Admit: 2022-01-15 | Discharge: 2022-01-15 | Disposition: A | Discharge status: Home / Self Care | Payer: BC Managed Care – PPO | Source: Ambulatory Visit | Attending: Family | Admitting: Family

## 2022-01-15 ENCOUNTER — Encounter: Payer: Self-pay | Admitting: Family

## 2022-01-15 ENCOUNTER — Ambulatory Visit (INDEPENDENT_AMBULATORY_CARE_PROVIDER_SITE_OTHER): Payer: BC Managed Care – PPO | Admitting: Family

## 2022-01-15 VITALS — BP 92/60 | HR 50 | Temp 97.8°F | Ht 73.75 in | Wt 198.3 lb

## 2022-01-15 DIAGNOSIS — Z0000 Encounter for general adult medical examination without abnormal findings: Secondary | ICD-10-CM

## 2022-01-15 DIAGNOSIS — M25562 Pain in left knee: Secondary | ICD-10-CM

## 2022-01-15 DIAGNOSIS — Z125 Encounter for screening for malignant neoplasm of prostate: Secondary | ICD-10-CM

## 2022-01-15 DIAGNOSIS — Z13220 Encounter for screening for lipoid disorders: Secondary | ICD-10-CM

## 2022-01-15 LAB — COMPREHENSIVE METABOLIC PANEL
ALT: 18 U/L (ref 0–53)
AST: 18 U/L (ref 0–37)
Albumin: 4.7 g/dL (ref 3.5–5.2)
Alkaline Phosphatase: 49 U/L (ref 39–117)
BUN: 15 mg/dL (ref 6–23)
CO2: 30 mEq/L (ref 19–32)
Calcium: 9.6 mg/dL (ref 8.4–10.5)
Chloride: 103 mEq/L (ref 96–112)
Creatinine, Ser: 1 mg/dL (ref 0.40–1.50)
GFR: 92.32 mL/min (ref >60.00)
Glucose, Bld: 79 mg/dL (ref 70–99)
Potassium: 4.4 mEq/L (ref 3.5–5.1)
Sodium: 141 mEq/L (ref 135–145)
Total Bilirubin: 0.5 mg/dL (ref 0.2–1.2)
Total Protein: 7.2 g/dL (ref 6.0–8.3)

## 2022-01-15 LAB — LIPID PANEL
Cholesterol: 196 mg/dL (ref 0–200)
HDL: 38.6 mg/dL — ABNORMAL LOW (ref >39.00)
LDL Cholesterol: 146 mg/dL — ABNORMAL HIGH (ref 0–99)
NonHDL: 157.38
Total CHOL/HDL Ratio: 5
Triglycerides: 57 mg/dL (ref 0.0–149.0)
VLDL: 11.4 mg/dL (ref 0.0–40.0)

## 2022-01-15 LAB — CBC WITH DIFFERENTIAL/PLATELET
Basophils Absolute: 0 10*3/uL (ref 0.0–0.1)
Basophils Relative: 0.7 % (ref 0.0–3.0)
Eosinophils Absolute: 0.1 10*3/uL (ref 0.0–0.7)
Eosinophils Relative: 2.9 % (ref 0.0–5.0)
HCT: 45 % (ref 39.0–52.0)
Hemoglobin: 15.1 g/dL (ref 13.0–17.0)
Lymphocytes Relative: 39.2 % (ref 12.0–46.0)
Lymphs Abs: 2 10*3/uL (ref 0.7–4.0)
MCHC: 33.6 g/dL (ref 30.0–36.0)
MCV: 90 fl (ref 78.0–100.0)
Monocytes Absolute: 0.3 10*3/uL (ref 0.1–1.0)
Monocytes Relative: 6.2 % (ref 3.0–12.0)
Neutro Abs: 2.6 10*3/uL (ref 1.4–7.7)
Neutrophils Relative %: 51 % (ref 43.0–77.0)
Platelets: 204 10*3/uL (ref 150.0–400.0)
RBC: 5 Mil/uL (ref 4.22–5.81)
RDW: 13.4 % (ref 11.5–15.5)
WBC: 5.2 10*3/uL (ref 4.0–10.5)

## 2022-01-15 LAB — PSA: PSA: 0.36 ng/mL (ref 0.10–4.00)

## 2022-01-15 MED ORDER — AMOXICILLIN 500 MG PO CAPS: 500.0000 mg | ORAL_CAPSULE | Freq: Three times a day (TID) | ORAL | 0 refills | Status: AC

## 2022-01-15 NOTE — Patient Instructions (Signed)
Health Maintenance, Male Adopting a healthy lifestyle and getting preventive care are important in promoting health and wellness. Ask your health care provider about: The right schedule for you to have regular tests and exams. Things you can do on your own to prevent diseases and keep yourself healthy. What should I know about diet, weight, and exercise? Eat a healthy diet  Eat a diet that includes plenty of vegetables, fruits, low-fat dairy products, and lean protein. Do not eat a lot of foods that are high in solid fats, added sugars, or sodium. Maintain a healthy weight Body mass index (BMI) is a measurement that can be used to identify possible weight problems. It estimates body fat based on height and weight. Your health care provider can help determine your BMI and help you achieve or maintain a healthy weight. Get regular exercise Get regular exercise. This is one of the most important things you can do for your health. Most adults should: Exercise for at least 150 minutes each week. The exercise should increase your heart rate and make you sweat (moderate-intensity exercise). Do strengthening exercises at least twice a week. This is in addition to the moderate-intensity exercise. Spend less time sitting. Even light physical activity can be beneficial. Watch cholesterol and blood lipids Have your blood tested for lipids and cholesterol at 43 years of age, then have this test every 5 years. You may need to have your cholesterol levels checked more often if: Your lipid or cholesterol levels are high. You are older than 43 years of age. You are at high risk for heart disease. What should I know about cancer screening? Many types of cancers can be detected early and may often be prevented. Depending on your health history and family history, you may need to have cancer screening at various ages. This may include screening for: Colorectal cancer. Prostate cancer. Skin cancer. Lung  cancer. What should I know about heart disease, diabetes, and high blood pressure? Blood pressure and heart disease High blood pressure causes heart disease and increases the risk of stroke. This is more likely to develop in people who have high blood pressure readings or are overweight. Talk with your health care provider about your target blood pressure readings. Have your blood pressure checked: Every 3-5 years if you are 18-39 years of age. Every year if you are 40 years old or older. If you are between the ages of 65 and 75 and are a current or former smoker, ask your health care provider if you should have a one-time screening for abdominal aortic aneurysm (AAA). Diabetes Have regular diabetes screenings. This checks your fasting blood sugar level. Have the screening done: Once every three years after age 45 if you are at a normal weight and have a low risk for diabetes. More often and at a younger age if you are overweight or have a high risk for diabetes. What should I know about preventing infection? Hepatitis B If you have a higher risk for hepatitis B, you should be screened for this virus. Talk with your health care provider to find out if you are at risk for hepatitis B infection. Hepatitis C Blood testing is recommended for: Everyone born from 1945 through 1965. Anyone with known risk factors for hepatitis C. Sexually transmitted infections (STIs) You should be screened each year for STIs, including gonorrhea and chlamydia, if: You are sexually active and are younger than 43 years of age. You are older than 43 years of age and your   health care provider tells you that you are at risk for this type of infection. Your sexual activity has changed since you were last screened, and you are at increased risk for chlamydia or gonorrhea. Ask your health care provider if you are at risk. Ask your health care provider about whether you are at high risk for HIV. Your health care provider  may recommend a prescription medicine to help prevent HIV infection. If you choose to take medicine to prevent HIV, you should first get tested for HIV. You should then be tested every 3 months for as long as you are taking the medicine. Follow these instructions at home: Alcohol use Do not drink alcohol if your health care provider tells you not to drink. If you drink alcohol: Limit how much you have to 0-2 drinks a day. Know how much alcohol is in your drink. In the [place] one drink equals one 12 oz bottle of beer (355 mL), one 5 oz glass of wine (148 mL), or one 1 oz glass of hard liquor (44 mL). Lifestyle Do not use any products that contain nicotine or tobacco. These products include cigarettes, chewing tobacco, and vaping devices, such as e-cigarettes. If you need help quitting, ask your health care provider. Do not use street drugs. Do not share needles. Ask your health care provider for help if you need support or information about quitting drugs. General instructions Schedule regular health, dental, and eye exams. Stay current with your vaccines. Tell your health care provider if: You often feel depressed. You have ever been abused or do not feel safe at home. Summary Adopting a healthy lifestyle and getting preventive care are important in promoting health and wellness. Follow your health care provider's instructions about healthy diet, exercising, and getting tested or screened for diseases. Follow your health care provider's instructions on monitoring your cholesterol and blood pressure. This information is not intended to replace advice given to you by your health care provider. Make sure you discuss any questions you have with your health care provider. Document Revised: 12/08/2020 Document Reviewed: 12/08/2020 Elsevier Patient Education  [place] and Cholesterol Restricted Eating Plan Eating a diet that limits fat and cholesterol may help lower your risk for  heart disease and other conditions. Your body needs fat and cholesterol for basic functions, but eating too much of these things can be harmful to your health. Your health care provider may order lab tests to check your blood fat (lipid) and cholesterol levels. This helps your health care provider understand your risk for certain conditions and whether you need to make diet changes. Work with your health care provider or dietitian to make an eating plan that is right for you. Your plan includes: Limit your fat intake to ______% or less of your total calories a day. This is ______g of fat per day. Limit your saturated fat intake to ______% or less of your total calories a day. This is ______g of saturated fat per day. Limit the amount of cholesterol in your diet to less than _________mg a day. Eat ___________ g of fiber a day. What are tips for following this plan? General guidelines If you are overweight, work with your health care provider to lose weight safely. Losing just 5-10% of your body weight can improve your overall health and help prevent diseases such as diabetes and heart disease. Avoid: Foods with added sugar. Fried foods. Foods that contain partially hydrogenated oils, including stick margarine, some tub margarines,  cookies, crackers, and other baked goods. If you drink alcohol: Limit how much you have to: 0-1 drink a day for women who are not pregnant. 0-2 drinks a day for men. Know how much alcohol is in a drink. In the [place] one drink equals one 12 oz bottle of beer (355 mL), one 5 oz glass of wine (148 mL), or one 1 oz glass of hard liquor (44 mL). Reading food labels Check food labels for: Trans fats or partially hydrogenated oils. Avoid foods that contain these. High amounts of saturated fat. Choose foods that are low in saturated fat (less than 2 g). The amount of cholesterol in each serving. The amount of fiber in each serving. Choose foods with healthy fats, such  as: Monounsaturated and polyunsaturated fats. These include olive and canola oil, flaxseeds, walnuts, almonds, and seeds. Omega-3 fats. These are found in foods such as salmon, mackerel, sardines, tuna, flaxseed oil, and ground flaxseeds. Choose grain products that have whole grains. Look for the word "whole" as the first word in the ingredient list. Cooking Cook foods using methods other than frying. Baking, boiling, grilling, and broiling are some healthy options. Eat more home-cooked food and less restaurant, buffet, and fast food. Avoid cooking using saturated fats. Animal sources of saturated fats include meats, butter, and cream. Plant sources of saturated fats include palm oil, palm kernel oil, and coconut oil. Meal planning  At meals, imagine dividing your plate into fourths: Fill one-half of your plate with vegetables, green salads, and fruit. Fill one-fourth of your plate with whole grains. Fill one-fourth of your plate with lean protein foods. Eat fish that is high in omega-3 fats at least two times a week. Eat more foods that contain fiber, such as whole grains, beans, apples, pears, berries, broccoli, carrots, peas, and barley. These foods help promote healthy cholesterol levels in the blood. What foods should I eat? Fruits All fresh, canned (in natural juice), or frozen fruits. Vegetables Fresh or frozen vegetables (raw, steamed, roasted, or grilled). Green salads. Grains Whole grains, such as whole wheat or whole grain breads, crackers, cereals, and pasta. Unsweetened oatmeal, bulgur, barley, quinoa, or brown rice. Corn or whole wheat flour tortillas. Meats and other proteins Ground beef (85% or leaner), grass-fed beef, or beef trimmed of fat. Skinless chicken or [place]. Ground chicken or [place]. Pork trimmed of fat. All fish and seafood. Egg whites. Dried beans, peas, or lentils. Unsalted nuts or seeds. Unsalted canned beans. Natural nut butters without added sugar and  oil. Dairy Low-fat or nonfat dairy products, such as skim or 1% milk, 2% or reduced-fat cheeses, low-fat and fat-free ricotta or cottage cheese, or plain low-fat and nonfat yogurt. Fats and oils Tub margarine without trans fats. Light or reduced-fat mayonnaise and salad dressings. Avocado. Olive, canola, sesame, or safflower oils. The items listed above may not be a complete list of foods and beverages you can eat. Contact a dietitian for more information. What foods should I avoid? Fruits Canned fruit in heavy syrup. Fruit in cream or butter sauce. Fried fruit. Vegetables Vegetables cooked in cheese, cream, or butter sauce. Fried vegetables. Grains White bread. White pasta. White rice. Cornbread. Bagels, pastries, and croissants. Crackers and snack foods that contain trans fat and hydrogenated oils. Meats and other proteins Fatty cuts of meat. Ribs, chicken wings, bacon, sausage, bologna, salami, chitterlings, fatback, hot dogs, bratwurst, and packaged lunch meats. Liver and organ meats. Whole eggs and egg yolks. Chicken and [place] with skin. Fried meat. Dairy  Whole or 2% milk, cream, half-and-half, and cream cheese. Whole milk cheeses. Whole-fat or sweetened yogurt. Full-fat cheeses. Nondairy creamers and whipped toppings. Processed cheese, cheese spreads, and cheese curds. Fats and oils Butter, stick margarine, lard, shortening, ghee, or bacon fat. Coconut, palm kernel, and palm oils. Beverages Alcohol. Sugar-sweetened drinks such as sodas, lemonade, and fruit drinks. Sweets and desserts Corn syrup, sugars, honey, and molasses. Candy. Jam and jelly. Syrup. Sweetened cereals. Cookies, pies, cakes, donuts, muffins, and ice cream. The items listed above may not be a complete list of foods and beverages you should avoid. Contact a dietitian for more information. Summary Your body needs fat and cholesterol for basic functions. However, eating too much of these things can be harmful to your  health. Work with your health care provider and dietitian to follow a diet that limits fat and cholesterol. Doing this may help lower your risk for heart disease and other conditions. Choose healthy fats, such as monounsaturated and polyunsaturated fats, and foods high in omega-3 fatty acids. Eat fiber-rich foods, such as whole grains, beans, peas, fruits, and vegetables. Limit or avoid alcohol, fried foods, and foods high in saturated fats, partially hydrogenated oils, and sugar. This information is not intended to replace advice given to you by your health care provider. Make sure you discuss any questions you have with your health care provider. Document Revised: 11/28/2020 Document Reviewed: 11/28/2020 Elsevier Patient Education  [place].

## 2022-01-18 ENCOUNTER — Other Ambulatory Visit: Payer: Self-pay | Admitting: Family

## 2022-01-18 DIAGNOSIS — M76892 Other specified enthesopathies of left lower limb, excluding foot: Secondary | ICD-10-CM

## 2022-01-18 DIAGNOSIS — M25562 Pain in left knee: Secondary | ICD-10-CM
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
## 2022-01-25 ENCOUNTER — Telehealth: Payer: Self-pay | Admitting: Family Medicine

## 2022-01-26 ENCOUNTER — Other Ambulatory Visit: Payer: Self-pay | Admitting: Family

## 2022-01-26 MED ORDER — ROSUVASTATIN CALCIUM 40 MG PO TABS: 40.0000 mg | ORAL_TABLET | Freq: Every day | ORAL | 3 refills | Status: DC

## 2022-01-26 NOTE — Telephone Encounter (Signed)
Patient informed of the message below.  Results message sent to [facility] regarding cholesterol medication.

## 2022-01-29 ENCOUNTER — Ambulatory Visit (INDEPENDENT_AMBULATORY_CARE_PROVIDER_SITE_OTHER): Payer: BC Managed Care – PPO | Admitting: Family

## 2022-01-29 DIAGNOSIS — M25562 Pain in left knee: Secondary | ICD-10-CM | POA: Diagnosis not present

## 2022-01-29 DIAGNOSIS — M7652 Patellar tendinitis, left knee: Secondary | ICD-10-CM | POA: Diagnosis not present

## 2022-01-29 DIAGNOSIS — G8929 Other chronic pain: Secondary | ICD-10-CM

## 2022-01-29 DIAGNOSIS — M76892 Other specified enthesopathies of left lower limb, excluding foot: Secondary | ICD-10-CM
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
## 2022-02-01 ENCOUNTER — Encounter: Payer: Self-pay | Admitting: Family

## 2022-02-12 ENCOUNTER — Ambulatory Visit
Admission: RE | Admit: 2022-02-12 | Discharge: 2022-02-12 | Disposition: A | Discharge status: Home / Self Care | Payer: BC Managed Care – PPO | Source: Ambulatory Visit | Attending: Family | Admitting: Family

## 2022-02-12 DIAGNOSIS — M25562 Pain in left knee: Secondary | ICD-10-CM | POA: Diagnosis not present

## 2022-02-12 DIAGNOSIS — G8929 Other chronic pain: Secondary | ICD-10-CM

## 2022-02-16 ENCOUNTER — Telehealth: Payer: Self-pay | Admitting: Orthopedic Surgery

## 2022-02-16 NOTE — Telephone Encounter (Signed)
Called patient left message to return call to schedule an MRI review with Dr. Marlou Sa

## 2022-02-17 ENCOUNTER — Telehealth: Payer: Self-pay | Admitting: Orthopedic Surgery

## 2022-02-17 NOTE — Telephone Encounter (Signed)
Patient called to schedule MRI review. Would like an appointment before 03/08/2022. His call back number is [phone]

## 2022-02-17 NOTE — Telephone Encounter (Signed)
scheduled

## 2022-02-22 ENCOUNTER — Ambulatory Visit: Payer: BC Managed Care – PPO | Admitting: Orthopedic Surgery

## 2022-02-22 DIAGNOSIS — M7652 Patellar tendinitis, left knee: Secondary | ICD-10-CM

## 2022-02-27 ENCOUNTER — Encounter: Payer: Self-pay | Admitting: Orthopedic Surgery
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
## 2022-04-08 DIAGNOSIS — I4891 Unspecified atrial fibrillation: Secondary | ICD-10-CM | POA: Diagnosis not present

## 2022-04-08 DIAGNOSIS — M545 Low back pain, unspecified: Secondary | ICD-10-CM | POA: Diagnosis not present

## 2022-04-08 DIAGNOSIS — M25562 Pain in left knee: Secondary | ICD-10-CM | POA: Diagnosis not present

## 2022-04-08 DIAGNOSIS — E785 Hyperlipidemia, unspecified: Secondary | ICD-10-CM | POA: Diagnosis not present

## 2022-05-10 DIAGNOSIS — F4323 Adjustment disorder with mixed anxiety and depressed mood: Secondary | ICD-10-CM | POA: Diagnosis not present

## 2022-05-20 DIAGNOSIS — F4323 Adjustment disorder with mixed anxiety and depressed mood: Secondary | ICD-10-CM | POA: Diagnosis not present

## 2022-06-03 DIAGNOSIS — F4323 Adjustment disorder with mixed anxiety and depressed mood: Secondary | ICD-10-CM | POA: Diagnosis not present

## 2022-06-17 DIAGNOSIS — F4323 Adjustment disorder with mixed anxiety and depressed mood: Secondary | ICD-10-CM | POA: Diagnosis not present

## 2022-07-06 DIAGNOSIS — F4323 Adjustment disorder with mixed anxiety and depressed mood: Secondary | ICD-10-CM | POA: Diagnosis not present

## 2022-07-22 DIAGNOSIS — F4323 Adjustment disorder with mixed anxiety and depressed mood: Secondary | ICD-10-CM | POA: Diagnosis not present

## 2022-07-23 DIAGNOSIS — R001 Bradycardia, unspecified: Secondary | ICD-10-CM | POA: Insufficient documentation

## 2022-07-30 ENCOUNTER — Encounter: Payer: Self-pay | Admitting: Internal Medicine

## 2022-07-30 ENCOUNTER — Ambulatory Visit: Payer: BC Managed Care – PPO | Attending: Internal Medicine | Admitting: Internal Medicine

## 2022-07-30 VITALS — BP 110/66 | HR 54 | Ht 73.75 in | Wt 211.8 lb

## 2022-07-30 DIAGNOSIS — R001 Bradycardia, unspecified: Secondary | ICD-10-CM | POA: Diagnosis not present

## 2022-07-30 DIAGNOSIS — Z8679 Personal history of other diseases of the circulatory system: Secondary | ICD-10-CM

## 2022-07-30 DIAGNOSIS — R002 Palpitations: Secondary | ICD-10-CM | POA: Diagnosis not present

## 2022-07-30 DIAGNOSIS — Z9889 Other specified postprocedural states: Secondary | ICD-10-CM
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
## 2022-07-30 NOTE — Patient Instructions (Signed)
Medication Instructions:  Your physician recommends that you continue on your current medications as directed. Please refer to the Current Medication list given to you today.  *If you need a refill on your cardiac medications before your next appointment, please call your pharmacy*  Follow-Up: At [facility], you and your health needs are our priority.  As part of our continuing mission to provide you with exceptional heart care, we have created designated Provider Care Teams.  These Care Teams include your primary Cardiologist (physician) and Advanced Practice Providers (APPs -  Physician Assistants and Nurse Practitioners) who all work together to provide you with the care you need, when you need it.  Your next appointment:   1 year(s)  The format for your next appointment:   In Person  Provider:   You may see Dr. Caryl Comes or one of the following Advanced Practice Providers on your designated Care Team:   Tommye Standard, [place] Legrand Como "Jonni Sanger" Chalmers Cater, [place]     Important Information About Sugar

## 2022-08-02 HISTORY — PX: TURBINATE REDUCTION: SHX6157

## 2022-08-05 DIAGNOSIS — D229 Melanocytic nevi, unspecified: Secondary | ICD-10-CM | POA: Diagnosis not present

## 2022-08-05 DIAGNOSIS — B078 Other viral warts: Secondary | ICD-10-CM | POA: Diagnosis not present

## 2022-08-05 DIAGNOSIS — D1801 Hemangioma of skin and subcutaneous tissue: Secondary | ICD-10-CM | POA: Diagnosis not present

## 2022-08-05 DIAGNOSIS — L814 Other melanin hyperpigmentation: Secondary | ICD-10-CM | POA: Diagnosis not present

## 2022-08-05 DIAGNOSIS — L821 Other seborrheic keratosis: Secondary | ICD-10-CM | POA: Diagnosis not present

## 2022-08-05 DIAGNOSIS — L578 Other skin changes due to chronic exposure to nonionizing radiation: Secondary | ICD-10-CM | POA: Diagnosis not present

## 2022-08-12 DIAGNOSIS — F4323 Adjustment disorder with mixed anxiety and depressed mood: Secondary | ICD-10-CM | POA: Diagnosis not present

## 2022-09-02 DIAGNOSIS — F4323 Adjustment disorder with mixed anxiety and depressed mood: Secondary | ICD-10-CM | POA: Diagnosis not present

## 2022-10-14 DIAGNOSIS — J310 Chronic rhinitis: Secondary | ICD-10-CM | POA: Diagnosis not present

## 2022-10-14 DIAGNOSIS — J343 Hypertrophy of nasal turbinates: Secondary | ICD-10-CM | POA: Diagnosis not present

## 2022-11-18 DIAGNOSIS — J3489 Other specified disorders of nose and nasal sinuses: Secondary | ICD-10-CM | POA: Diagnosis not present

## 2022-11-18 DIAGNOSIS — J343 Hypertrophy of nasal turbinates: Secondary | ICD-10-CM | POA: Diagnosis not present

## 2022-12-21 DIAGNOSIS — J3089 Other allergic rhinitis: Secondary | ICD-10-CM | POA: Diagnosis not present

## 2022-12-21 DIAGNOSIS — H1045 Other chronic allergic conjunctivitis: Secondary | ICD-10-CM | POA: Diagnosis not present

## 2023-02-24 ENCOUNTER — Other Ambulatory Visit: Payer: Self-pay | Admitting: Family

## 2023-03-24 ENCOUNTER — Telehealth: Payer: Self-pay | Admitting: Family Medicine

## 2023-03-24 MED ORDER — ROSUVASTATIN CALCIUM 40 MG PO TABS: 40.0000 mg | ORAL_TABLET | Freq: Every day | ORAL | 0 refills | Status: DC

## 2023-03-24 NOTE — Telephone Encounter (Signed)
Ok to refill until his appointment

## 2023-03-24 NOTE — Telephone Encounter (Signed)
Prescription Request  03/24/2023  LOV: Visit date not found  What is the name of the medication or equipment? rosuvastatin rosuvastatin (CRESTOR) 40 MG tablet  Have you contacted your pharmacy to request a refill? Yes  Pt states he'll be out next week. Pt states he requested a refill and was told to reach out to PCP.  Pt has been scheduled for a TOC on 05/12/23  Which pharmacy would you like this sent to?   [id number] - [place], [place] - [address] [place] [address] Phone: [phone] Fax: [phone]    Patient notified that their request is being sent to the clinical staff for review and that they should receive a response within 2 business days.   Please advise at Mobile [phone] (mobile)

## 2023-03-24 NOTE — Telephone Encounter (Signed)
Rx done. 

## 2023-03-31 DIAGNOSIS — Z0189 Encounter for other specified special examinations: Secondary | ICD-10-CM | POA: Diagnosis not present

## 2023-04-06 DIAGNOSIS — R253 Fasciculation: Secondary | ICD-10-CM | POA: Diagnosis not present

## 2023-04-06 DIAGNOSIS — M25562 Pain in left knee: Secondary | ICD-10-CM | POA: Diagnosis not present

## 2023-04-06 DIAGNOSIS — J309 Allergic rhinitis, unspecified: Secondary | ICD-10-CM | POA: Diagnosis not present

## 2023-04-06 DIAGNOSIS — M5432 Sciatica, left side: Secondary | ICD-10-CM | POA: Diagnosis not present

## 2023-05-07 ENCOUNTER — Other Ambulatory Visit: Payer: Self-pay | Admitting: Family Medicine

## 2023-05-12 ENCOUNTER — Ambulatory Visit: Payer: BC Managed Care – PPO | Admitting: Family Medicine

## 2023-05-12 ENCOUNTER — Encounter: Payer: Self-pay | Admitting: Family Medicine

## 2023-05-12 VITALS — BP 100/64 | HR 57 | Temp 98.4°F | Ht 73.5 in | Wt 206.5 lb

## 2023-05-12 DIAGNOSIS — E782 Mixed hyperlipidemia: Secondary | ICD-10-CM | POA: Diagnosis not present

## 2023-05-12 DIAGNOSIS — Z1211 Encounter for screening for malignant neoplasm of colon: Secondary | ICD-10-CM

## 2023-05-12 DIAGNOSIS — Z1159 Encounter for screening for other viral diseases: Secondary | ICD-10-CM

## 2023-05-12 MED ORDER — ROSUVASTATIN CALCIUM 40 MG PO TABS
40.0000 mg | ORAL_TABLET | Freq: Every day | ORAL | 0 refills | Status: DC
Start: 2023-05-12 — End: 2023-08-23

## 2023-05-12 NOTE — Progress Notes (Signed)
Established Patient Office Visit  Subjective   Patient ID: George Singleton, male    DOB: 08-10-1980  Age: 42 y.o. MRN: [id number]  Chief Complaint  Patient presents with   Transitions Of Care    Pt is here for [facility] visit. Patient states he also is cared for by the [place]. States that he is on rosuvastatin 40 mg daily. He denies any side effects to the medication. He reports that his [place] doctor has recommended he stay on the cholesterol medication. His overall cardiac risk is still very low, we discussed that he could opt to get a CT calcium score to decide if he really needed such a large dose of cholesterol medication.   Pt also has a history of allergies, is on levocetirizine and flonase nasal spray.   I have reviewed all aspects of the patient's medical history including social, family, and surgical history.     Current Outpatient Medications  Medication Instructions   Azelastine HCl 137 MCG/SPRAY SOLN 1 - 2 puffs daily   baclofen (LIORESAL) 20 mg, Oral, Daily PRN   Calcium Carbonate-Vit D-Min (CALTRATE 600+D PLUS MINERALS) 600-800 MG-UNIT TABS 1 tablet, Oral, 2 times daily   diclofenac Sodium (VOLTAREN) 1 % GEL APPLY 2 GRAMS TO AFFECTED AREA 4 TIMES A DAY   fluticasone (FLONASE) 50 MCG/ACT nasal spray SPRAY 2 SPRAYS INTO EACH NOSTRIL EVERY DAY   ibuprofen (ADVIL) 600 mg, Oral, Daily PRN   ketotifen (ZADITOR) 0.025 % ophthalmic solution Ophthalmic   levocetirizine (XYZAL) 5 MG tablet SMARTSIG:1 Tablet(s) By Mouth Every Evening   lidocaine (LIDODERM) 5 % 1 patch, Transdermal, As needed, Remove & Discard patch within 12 hours or as directed by MD    magnesium oxide (MAG-OX) 400 mg, Oral, Daily   Multiple Vitamins-Minerals (MULTIVITAMIN ADULT PO) 1 tablet, Oral, Daily   Omega-3 Fatty Acids (FISH OIL PO) 1 capsule, Oral, Daily   omeprazole (PRILOSEC) 20 MG capsule TAKE ONE CAPSULE BY MOUTH DAILY AS NEEDED (TAKE ON AN EMPTY STOMACH 30 MINUTES PRIOR TO A MEAL)   psyllium (METAMUCIL) 58.6 %  powder 1 packet, Oral, Daily   rosuvastatin (CRESTOR) 40 mg, Oral, Daily   tretinoin (RETIN-A) 0.05 % cream APPLY TO AFFECTED AREA EVERY DAY AT BEDTIME    Patient Active Problem List   Diagnosis Date Noted   Bradycardia 07/23/2022   Chronic pain syndrome 03/19/2021   Piriformis syndrome of right side 07/21/2020   Acute pain of right knee 05/22/2020   Palpitations 03/23/2020   Finger pain, right 11/20/2019   Enthesopathy of left knee region 10/09/2019   Chronic pain of left knee 07/30/2019   Acute pain of left knee 06/25/2019   Leg length discrepancy 03/19/2019   Facet arthropathy, lumbar 02/08/2019   Lumbosacral spondylosis without myelopathy 01/26/2019   Myalgia 12/06/2018   Seasonal allergies 11/24/2018   Sacroiliac pain 10/06/2018   Right hip pain 08/25/2018   Lumbosacral radiculitis 07/19/2018   Hemorrhoids 08/30/2016   Foot pain, right 08/30/2016   S/P ablation of atrial fibrillation 02/20/2015   Hyperlipemia 02/20/2015   Chronic left-sided low back pain with left-sided sciatica 02/20/2015      Review of Systems  All other systems reviewed and are negative.     Objective:     BP 100/64 (BP Location: Left Arm, Patient Position: Sitting, Cuff Size: Large)   Pulse (!) 57   Temp 98.4 F (36.9 C) (Oral)   Ht 6' 1.5" (1.867 m)   Wt 206 lb 8 oz (93.7  kg)   SpO2 97%   BMI 26.87 kg/m    Physical Exam Vitals reviewed.  Constitutional:      Appearance: Normal appearance. He is well-groomed and normal weight.  Eyes:     Extraocular Movements: Extraocular movements intact.     Conjunctiva/sclera: Conjunctivae normal.  Cardiovascular:     Rate and Rhythm: Normal rate and regular rhythm.     Heart sounds: S1 normal and S2 normal. No murmur heard. Pulmonary:     Effort: Pulmonary effort is normal.     Breath sounds: Normal breath sounds and air entry. No rales.  Abdominal:     General: Abdomen is flat. Bowel sounds are normal.  Musculoskeletal:     Right lower  leg: No edema.     Left lower leg: No edema.  Neurological:     General: No focal deficit present.     Mental Status: He is alert and oriented to person, place, and time.     Gait: Gait is intact.  Psychiatric:        Mood and Affect: Mood and affect normal.      No results found for any visits on 05/12/23.   The 10-year ASCVD risk score (Arnett DK, et al., 2019) is: 1.6%    Assessment & Plan:  Mixed hyperlipidemia Assessment & Plan: Reviewed his last panel, will continue the rosuvastatin 40 mg daily, his LDL continues to be high and his HDL is low, however his overall risk of CAD is still somewhat low, will continue as prescribed per patient's request. Needs new labs today.   Orders: -     Comprehensive metabolic panel; Future -     Lipid panel; Future -     Rosuvastatin Calcium; Take 1 tablet (40 mg total) by mouth daily.  Dispense: 90 tablet; Refill: 0  Colon cancer screening -     Cologuard; Future  Need for hepatitis C screening test -     Hepatitis C antibody; Future     No follow-ups on file.    Karie Georges, MD

## 2023-05-24 ENCOUNTER — Other Ambulatory Visit: Payer: BC Managed Care – PPO

## 2023-05-24 ENCOUNTER — Other Ambulatory Visit (INDEPENDENT_AMBULATORY_CARE_PROVIDER_SITE_OTHER): Payer: BC Managed Care – PPO

## 2023-05-24 DIAGNOSIS — E782 Mixed hyperlipidemia: Secondary | ICD-10-CM | POA: Diagnosis not present

## 2023-05-24 DIAGNOSIS — Z1159 Encounter for screening for other viral diseases: Secondary | ICD-10-CM

## 2023-05-24 LAB — COMPREHENSIVE METABOLIC PANEL
ALT: 24 U/L (ref 0–53)
AST: 22 U/L (ref 0–37)
Albumin: 4.6 g/dL (ref 3.5–5.2)
Alkaline Phosphatase: 43 U/L (ref 39–117)
BUN: 19 mg/dL (ref 6–23)
CO2: 27 meq/L (ref 19–32)
Calcium: 9.7 mg/dL (ref 8.4–10.5)
Chloride: 105 meq/L (ref 96–112)
Creatinine, Ser: 0.97 mg/dL (ref 0.40–1.50)
GFR: 94.85 mL/min (ref >60.00)
Glucose, Bld: 87 mg/dL (ref 70–99)
Potassium: 4.6 meq/L (ref 3.5–5.1)
Sodium: 141 meq/L (ref 135–145)
Total Bilirubin: 0.6 mg/dL (ref 0.2–1.2)
Total Protein: 7.1 g/dL (ref 6.0–8.3)

## 2023-05-24 LAB — LIPID PANEL
Cholesterol: 165 mg/dL (ref 0–200)
HDL: 29.7 mg/dL — ABNORMAL LOW (ref >39.00)
LDL Cholesterol: 124 mg/dL — ABNORMAL HIGH (ref 0–99)
NonHDL: 135.37
Total CHOL/HDL Ratio: 6
Triglycerides: 57 mg/dL (ref 0.0–149.0)
VLDL: 11.4 mg/dL (ref 0.0–40.0)

## 2023-05-25 LAB — HEPATITIS C ANTIBODY: Hepatitis C Ab: NONREACTIVE

## 2023-05-30 ENCOUNTER — Encounter: Payer: Self-pay | Admitting: Family Medicine

## 2023-05-30 NOTE — Assessment & Plan Note (Addendum)
Reviewed his last panel, will continue the rosuvastatin 40 mg daily, his LDL continues to be high and his HDL is low, however his overall risk of CAD is still somewhat low, will continue as prescribed per patient's request. Needs new labs today.

## 2023-06-14 ENCOUNTER — Ambulatory Visit (INDEPENDENT_AMBULATORY_CARE_PROVIDER_SITE_OTHER): Payer: BC Managed Care – PPO | Admitting: Otolaryngology

## 2023-06-14 ENCOUNTER — Encounter (INDEPENDENT_AMBULATORY_CARE_PROVIDER_SITE_OTHER): Payer: Self-pay

## 2023-06-14 VITALS — Ht 73.0 in | Wt 195.0 lb

## 2023-06-14 DIAGNOSIS — L0100 Impetigo, unspecified: Secondary | ICD-10-CM

## 2023-06-14 DIAGNOSIS — J340 Abscess, furuncle and carbuncle of nose: Secondary | ICD-10-CM

## 2023-06-14 DIAGNOSIS — J310 Chronic rhinitis: Secondary | ICD-10-CM | POA: Diagnosis not present

## 2023-06-18 DIAGNOSIS — J310 Chronic rhinitis: Secondary | ICD-10-CM | POA: Insufficient documentation

## 2023-06-18 DIAGNOSIS — J340 Abscess, furuncle and carbuncle of nose: Secondary | ICD-10-CM | POA: Insufficient documentation

## 2023-06-18 NOTE — Progress Notes (Signed)
Patient ID: George Singleton, male   DOB: 05-21-1981, 42 y.o.   MRN: [id number]  Follow-up: Chronic nasal congestion  HPI: The patient is a 42 year old male who returns today for his follow-up evaluation.  The patient has a history of chronic nasal congestion.  He was noted to have nasal mucosal congestion and bilateral severe inferior turbinate hypertrophy.  He underwent bilateral turbinate reduction surgery in April 2024.  The patient returns today reporting improvement in his nasal breathing.  He still has occasional allergy symptoms.  He is currently on Zyrtec, Flonase, and azelastine.  The patient also has a new complaint of a left nasal sore over the past week.  Currently he denies any fever or visual change.  Exam: General: Communicates without difficulty, well nourished, no acute distress. Head: Normocephalic, no evidence injury, no tenderness, facial buttresses intact without stepoff. Face/sinus: No tenderness to palpation and percussion. Facial movement is normal and symmetric. Eyes: PERRL, EOMI. No scleral icterus, conjunctivae clear. Neuro: CN II exam reveals vision grossly intact.  No nystagmus at any point of gaze. Ears: Auricles well formed without lesions.  Ear canals are intact without mass or lesion.  No erythema or edema is appreciated.  The TMs are intact without fluid. Nose: External evaluation reveals normal support and skin without lesions.  Dorsum is intact.  Anterior rhinoscopy reveals congested mucosa over anterior aspect of inferior turbinates and intact septum.  No purulence noted.  Left nasal impetigo.  Oral:  Oral cavity and oropharynx are intact, symmetric, without erythema or edema.  Mucosa is moist without lesions. Neck: Full range of motion without pain.  There is no significant lymphadenopathy.  No masses palpable.  Thyroid bed within normal limits to palpation.  Parotid glands and submandibular glands equal bilaterally without mass.  Trachea is midline. Neuro:  CN 2-12  grossly intact.   Assessment: 1.  Chronic rhinitis with nasal mucosal congestion.  His turbinates are well-healed.  Both nasal passageways are patent. 2.  Left nasal impetigo.  Plan: 1.  The physical exam findings are reviewed with the patient. 2.  Continue with his allergy treatment regimen of Zyrtec, Flonase, and azelastine as needed. 3.  Bactroban ointment to treat the nasal impetigo. 4.  The patient is encouraged to call with any questions or concerns.
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
## 2023-08-05 ENCOUNTER — Encounter: Payer: Self-pay | Admitting: Family Medicine

## 2023-08-05 ENCOUNTER — Ambulatory Visit: Payer: BC Managed Care – PPO | Attending: Internal Medicine | Admitting: Internal Medicine

## 2023-08-05 ENCOUNTER — Encounter: Payer: Self-pay | Admitting: Internal Medicine

## 2023-08-05 VITALS — BP 110/74 | Ht 73.0 in | Wt 216.6 lb

## 2023-08-05 DIAGNOSIS — R002 Palpitations: Secondary | ICD-10-CM

## 2023-08-05 DIAGNOSIS — Z9889 Other specified postprocedural states: Secondary | ICD-10-CM

## 2023-08-05 DIAGNOSIS — E785 Hyperlipidemia, unspecified: Secondary | ICD-10-CM | POA: Diagnosis not present

## 2023-08-05 DIAGNOSIS — R001 Bradycardia, unspecified: Secondary | ICD-10-CM | POA: Diagnosis not present

## 2023-08-05 DIAGNOSIS — Z8679 Personal history of other diseases of the circulatory system: Secondary | ICD-10-CM

## 2023-08-05 NOTE — Patient Instructions (Signed)
 Medication Instructions:  Your physician recommends that you continue on your current medications as directed. Please refer to the Current Medication list given to you today.  *If you need a refill on your cardiac medications before your next appointment, please call your pharmacy*   Lab Work: None ordered.  If you have labs (blood work) drawn today and your tests are completely normal, you will receive your results only by: MyChart Message (if you have MyChart) OR A paper copy in the mail If you have any lab test that is abnormal or we need to change your treatment, we will call you to review the results.   Testing/Procedures: Dr Fernande would like for you to have a Cardiac Calcium  Score CT   Follow-Up: At [facility], you and your health needs are our priority.  As part of our continuing mission to provide you with exceptional heart care, we have created designated Provider Care Teams.  These Care Teams include your primary Cardiologist (physician) and Advanced Practice Providers (APPs -  Physician Assistants and Nurse Practitioners) who all work together to provide you with the care you need, when you need it.  We recommend signing up for the patient portal called MyChart.  Sign up information is provided on this After Visit Summary.  MyChart is used to connect with patients for Virtual Visits (Telemedicine).  Patients are able to view lab/test results, encounter notes, upcoming appointments, etc.  Non-urgent messages can be sent to your provider as well.   To learn more about what you can do with MyChart, go to [email or web address].    Your next appointment:   12 months with Dr Fernande

## 2023-08-18 DIAGNOSIS — D229 Melanocytic nevi, unspecified: Secondary | ICD-10-CM | POA: Diagnosis not present

## 2023-08-18 DIAGNOSIS — L578 Other skin changes due to chronic exposure to nonionizing radiation: Secondary | ICD-10-CM | POA: Diagnosis not present

## 2023-08-18 DIAGNOSIS — L814 Other melanin hyperpigmentation: Secondary | ICD-10-CM | POA: Diagnosis not present

## 2023-08-18 DIAGNOSIS — B078 Other viral warts: Secondary | ICD-10-CM | POA: Diagnosis not present

## 2023-08-18 DIAGNOSIS — L219 Seborrheic dermatitis, unspecified: Secondary | ICD-10-CM | POA: Diagnosis not present

## 2023-08-23 ENCOUNTER — Encounter: Payer: Self-pay | Admitting: Family Medicine

## 2023-08-23 ENCOUNTER — Telehealth (INDEPENDENT_AMBULATORY_CARE_PROVIDER_SITE_OTHER): Payer: BC Managed Care – PPO | Admitting: Family Medicine

## 2023-08-23 VITALS — Wt 202.0 lb

## 2023-08-23 DIAGNOSIS — F321 Major depressive disorder, single episode, moderate: Secondary | ICD-10-CM | POA: Diagnosis not present

## 2023-08-23 MED ORDER — BUPROPION HCL ER (XL) 150 MG PO TB24
150.0000 mg | ORAL_TABLET | Freq: Every day | ORAL | 2 refills | Status: DC
Start: 2023-08-23 — End: 2023-09-14

## 2023-08-23 NOTE — Assessment & Plan Note (Signed)
New diagnosis, pt had SE on previous SNRI and TCA, we had a long discussion about wellbutrin and I advised we start this medication at 150 mg daily. Risks/benefits were discussed. Pt is also interested in restarting therapy, will place referral for this as well. I will see him back for another video visit in 2 months to follow up on his symptoms.
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
## 2023-08-24 ENCOUNTER — Ambulatory Visit (HOSPITAL_COMMUNITY)
Admission: RE | Admit: 2023-08-24 | Discharge: 2023-08-24 | Disposition: A | Discharge status: Home / Self Care | Payer: Self-pay | Source: Ambulatory Visit | Attending: Internal Medicine | Admitting: Internal Medicine

## 2023-08-24 DIAGNOSIS — E785 Hyperlipidemia, unspecified: Secondary | ICD-10-CM | POA: Insufficient documentation

## 2023-08-24 DIAGNOSIS — R002 Palpitations: Secondary | ICD-10-CM | POA: Insufficient documentation

## 2023-08-25 ENCOUNTER — Other Ambulatory Visit: Payer: Self-pay | Admitting: Family Medicine

## 2023-08-25 DIAGNOSIS — E782 Mixed hyperlipidemia: Secondary | ICD-10-CM

## 2023-08-29 ENCOUNTER — Encounter: Payer: Self-pay | Admitting: Internal Medicine

## 2023-08-29 DIAGNOSIS — E785 Hyperlipidemia, unspecified: Secondary | ICD-10-CM

## 2023-08-29 DIAGNOSIS — Z79899 Other long term (current) drug therapy: Secondary | ICD-10-CM

## 2023-09-01 ENCOUNTER — Ambulatory Visit: Payer: BC Managed Care – PPO | Admitting: Psychology

## 2023-09-01 DIAGNOSIS — F4321 Adjustment disorder with depressed mood: Secondary | ICD-10-CM | POA: Diagnosis not present
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
## 2023-09-01 NOTE — Addendum Note (Signed)
Addended by: Alois Cliche on: 09/01/2023 09:07 AM   Modules accepted: Orders

## 2023-09-14 ENCOUNTER — Other Ambulatory Visit: Payer: Self-pay | Admitting: Family Medicine

## 2023-09-14 ENCOUNTER — Ambulatory Visit: Payer: BC Managed Care – PPO | Admitting: Psychology

## 2023-09-14 DIAGNOSIS — F321 Major depressive disorder, single episode, moderate: Secondary | ICD-10-CM

## 2023-09-14 DIAGNOSIS — F4321 Adjustment disorder with depressed mood: Secondary | ICD-10-CM | POA: Diagnosis not present

## 2023-09-14 NOTE — Progress Notes (Signed)
 [facility] Counselor/Therapist Progress Note  Patient ID: George Singleton, MRN: [id number],    Date: 09/14/2023  Time Spent:  60 minutes    start time: 1100    end time: 1200  Treatment Type: Individual Therapy  Reported Symptoms: Pt presents for session, via Caregility video, granting consent for the session.  Pt shares he is aware of the limitations of video sessions and that he is currently in his office with no one else present.  I shared with pt that I am in my office with no one else present here.  Mental Status Exam: Appearance:  Casual     Behavior: Appropriate  Motor: Normal  Speech/Language:  Clear and Coherent  Affect: Appropriate  Mood: depressed  Thought process: normal  Thought content:   WNL  Sensory/Perceptual disturbances:   WNL  Orientation: oriented to person, place, and time/date  Attention: Good  Concentration: Good  Memory: WNL  Fund of knowledge:  Good  Insight:   Good  Judgment:  Good  Impulse Control: Good   Risk Assessment: Danger to Self:  No Self-injurious Behavior: No Danger to Others: No Duty to Warn:no Physical Aggression / Violence:No  Access to Firearms a concern: No  Gang Involvement:No   Subjective: Talked with pt about his relationship history.  Pt shares that "I was strongly guided to a certain path in my life by my parents, especially my mom ([place]).  She wanted [place] to be successful.  Academics were always number one with [place].  My parents encouraged my desire to go to [employer] and it took a lot to get there.  I am an introvert at my core and I was socially awkward in HS.  There were about 80% men and 20% women and it was a very male dominated culture.  I did not have a significant, long-term relationship until I got to [place] in the [employer]  I met a [place] woman in [place] and we had a relationship; she was 7 yrs older than me and I think she wanted her ticket to the [place].  She got pregnant and my daughter was born while I  was in [place].  My daughter would visit about once per year and I have lost contact with her; she is 59 yo at this point."  Pt shares that he met another woman who was from [place] and he enjoyed that relationship.  He felt pressure from this woman to move their relationship forward but he was not comfortable with that.  Pt met [name] in the [place] airport; she was recently divorced and had a son.  Pt shared that shortly after that they went to [employer] party and their relationship was on at that point.  They dated for 2.5 yrs and were married for 2.5 yrs before they had [place] (45 yo).  Amanda's son is grown now; they were estranged during the son's formative years and he disengaged until after he got married.  They both are estranged from their first children.  Amanda's son has been in the [employer] for some time and has been deployed several times.  Pt shares that [name] was very warm when they first met, he felt comfortable with her, she was easy to talk to, she was attractive, she was intelligent Banker working for a [employer]), etc.  Pt grew up here in [place] and [place] grew up in [place].  Her adopted mom is still living and [name] has never met her biological parents.  [name] is 9 yrs older than pt.  Her adoption was closed and she has very little info about where she was born and the circumstances of her adoption.  She is also a twin and her adopted parents took both girls.  Her dad passed away about 9 yrs ago; her parents were also foster parents.  Pt also shares that his dad was also in the Army; his great grandfather and grandfather both were career [employer]; his father met pt's mom in the Army.  His mom became an Retail buyer in [place] and they met there.  Father got is master's degree in Public relations account executive from [employer] and started the family business.  Pt has a sister who is 4 yrs younger and has been very competitive with him.  She also went to [employer] and is still in [employer];  "we have had a falling out and I have not talked to her in about 6 yrs.  I wanted to attempt a reconciliation last year but she could not abide by the limits I needed from her."  Encouraged pt to continue with his self care activities and we will meet in 2 wks for a follow up session.  Interventions: Cognitive Behavioral Therapy  Diagnosis:Adjustment disorder with depressed mood  Plan: Treatment Plan Strengths/Abilities:  Intelligent, Intuitive, Willing to participate in therapy Treatment Preferences:  Outpatient Individual Therapy Statement of Needs:  Patient is to use CBT, mindfulness and coping skills to help manage and/or decrease symptoms associated with their diagnosis. Symptoms:  Depressed/Irritable mood, worry, social withdrawal Problems Addressed:  Depressive thoughts, Sadness, Sleep issues, etc. Long Term Goals:  Pt to reduce overall level, frequency, and intensity of the feelings of depression as evidenced by decreased irritability, negative self talk, and helpless feelings from 6 to 7 days/week to 0 to 1 days/week, per client report, for at least 3 consecutive months.  Progress: 30% Short Term Goals:  Pt to verbally express understanding of the relationship between feelings of depression and their impact on thinking patterns and behaviors.  Pt to verbalize an understanding of the role that distorted thinking plays in creating fears, excessive worry, and ruminations.  Progress: 30% Target Date:  09/13/2024 Frequency:  Bi-weekly Modality:  Cognitive Behavioral Therapy Interventions by Therapist:  Therapist will use CBT, Mindfulness exercises, Coping skills and Referrals, as needed by client. Client has verbally approved this treatment plan.  Karie Kirks, [facility]

## 2023-09-26 ENCOUNTER — Encounter: Payer: Self-pay | Admitting: Family Medicine

## 2023-09-26 DIAGNOSIS — F321 Major depressive disorder, single episode, moderate: Secondary | ICD-10-CM

## 2023-09-29 ENCOUNTER — Ambulatory Visit: Payer: BC Managed Care – PPO | Admitting: Psychology

## 2023-09-29 DIAGNOSIS — F4321 Adjustment disorder with depressed mood: Secondary | ICD-10-CM | POA: Diagnosis not present

## 2023-09-29 NOTE — Progress Notes (Signed)
 [facility] Counselor/Therapist Progress Note  Patient ID: George Singleton, MRN: [id number],    Date: 09/29/2023  Time Spent:  60 minutes    start time: 1500    end time: 1600  Treatment Type: Individual Therapy  Reported Symptoms: Pt presents for session, via Caregility video, granting consent for the session.  Pt shares he is aware of the limitations of video sessions and that he is currently in his office with no one else present.  I shared with pt that I am in my office with no one else present here.  Mental Status Exam: Appearance:  Casual     Behavior: Appropriate  Motor: Normal  Speech/Language:  Clear and Coherent  Affect: Appropriate  Mood: depressed  Thought process: normal  Thought content:   WNL  Sensory/Perceptual disturbances:   WNL  Orientation: oriented to person, place, and time/date  Attention: Good  Concentration: Good  Memory: WNL  Fund of knowledge:  Good  Insight:   Good  Judgment:  Good  Impulse Control: Good   Risk Assessment: Danger to Self:  No Self-injurious Behavior: No Danger to Others: No Duty to Warn:no Physical Aggression / Violence:No  Access to Firearms a concern: No  Gang Involvement:No   Subjective: Pt describes work has been very busy lately.  "I have two projects due very soon and both of them depend on input from other people."  Pt shares that in HS he was "a big fish in a small pond.  At [facility], everyone was a big fish."  Pt went to [employer] because he had generations in his family who served in the [employer]   His dad attended the [employer] and owns the family construction business.  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  Pt shares that he continues to take his Wellbutrin daily and believes it is helping his mood.  He and [name] are getting along OK; he feels there is room for improvement.  Pt shares that [name] has noticed and commented about pt's mood improvement.  Pt  and [name] and [name] went to [place] last week for a long weekend and they had a good trip.  Pt shares that [name] had been sleeping with them in their bed for several months and pt did not want that to continue; she has gone back to sleeping in her own bed for most of the time.  [name] is being treated for Type 1 Diabetes.  Pt shares that [name] is coddled by [employer].  Encouraged pt to consider having a conversation with [name] that leads to [place] being back in her bed full time.  Pt will consider doing so and we will talk more about it next session.  Encouraged pt to continue with his self care activities and we will meet in 2 wks for a follow up session.  Interventions: Cognitive Behavioral Therapy  Diagnosis:Adjustment disorder with depressed mood  Plan: Treatment Plan Strengths/Abilities:  Intelligent, Intuitive, Willing to participate in therapy Treatment Preferences:  Outpatient Individual Therapy Statement of Needs:  Patient is to use CBT, mindfulness and coping skills to help manage and/or decrease symptoms associated with their diagnosis. Symptoms:  Depressed/Irritable mood, worry, social withdrawal Problems Addressed:  Depressive thoughts, Sadness, Sleep issues, etc. Long Term Goals:  Pt to reduce overall level, frequency, and intensity of the feelings of depression as evidenced by decreased irritability, negative self talk, and helpless feelings from 6 to 7 days/week to 0  to 1 days/week, per client report, for at least 3 consecutive months.  Progress: 30% Short Term Goals:  Pt to verbally express understanding of the relationship between feelings of depression and their impact on thinking patterns and behaviors.  Pt to verbalize an understanding of the role that distorted thinking plays in creating fears, excessive worry, and ruminations.  Progress: 30% Target Date:  09/13/2024 Frequency:  Bi-weekly Modality:  Cognitive Behavioral Therapy Interventions by Therapist:  Therapist  will use CBT, Mindfulness exercises, Coping skills and Referrals, as needed by client. Client has verbally approved this treatment plan.  Karie Kirks, [facility]

## 2023-10-07 DIAGNOSIS — F419 Anxiety disorder, unspecified: Secondary | ICD-10-CM | POA: Diagnosis not present

## 2023-10-07 DIAGNOSIS — M5442 Lumbago with sciatica, left side: Secondary | ICD-10-CM | POA: Diagnosis not present

## 2023-10-07 DIAGNOSIS — J309 Allergic rhinitis, unspecified: Secondary | ICD-10-CM | POA: Diagnosis not present

## 2023-10-07 DIAGNOSIS — R519 Headache, unspecified: Secondary | ICD-10-CM | POA: Diagnosis not present

## 2023-10-13 ENCOUNTER — Ambulatory Visit: Payer: BC Managed Care – PPO | Admitting: Psychology

## 2023-10-13 DIAGNOSIS — F4321 Adjustment disorder with depressed mood: Secondary | ICD-10-CM | POA: Diagnosis not present

## 2023-10-13 MED ORDER — BUPROPION HCL ER (XL) 300 MG PO TB24: 300.0000 mg | ORAL_TABLET | Freq: Every day | ORAL | 0 refills | Status: DC

## 2023-10-13 NOTE — Progress Notes (Signed)
 [facility] Counselor/Therapist Progress Note  Patient ID: George Singleton, MRN: [id number],    Date: 10/13/2023  Time Spent:  60 minutes    start time: 1400    end time: 1500  Treatment Type: Individual Therapy  Reported Symptoms: Pt presents for session, in person in the office, granting consent for the session.   Mental Status Exam: Appearance:  Casual     Behavior: Appropriate  Motor: Normal  Speech/Language:  Clear and Coherent  Affect: Appropriate  Mood: depressed  Thought process: normal  Thought content:   WNL  Sensory/Perceptual disturbances:   WNL  Orientation: oriented to person, place, and time/date  Attention: Good  Concentration: Good  Memory: WNL  Fund of knowledge:  Good  Insight:   Good  Judgment:  Good  Impulse Control: Good   Risk Assessment: Danger to Self:  No Self-injurious Behavior: No Danger to Others: No Duty to Warn:no Physical Aggression / Violence:No  Access to Firearms a concern: No  Gang Involvement:No   Subjective: Pt shares that he has been communicating with Dr. Casimiro Needle about his Wellbutrin and feels like he may need something more and she is going to increase his dose soon.  Pt shares that [name] has been sleeping in her bed of late.  Pt mentions that he believes that [name] is not eating as healthy as is best for her; important due to her Type 1 Diabetes.  Pt shares that he had a birthday since our last session.  He did not feel like [name] put as much effort into his celebration as she had in the past.  Pt shares that work continues to be busy for him; they have some concerns about the economy and the political impact on their busy.  His dad is 51 yo and is still a 70% owner of the company; his dad is less involved with the day to day work being done.  Pt is wondering how his dad's retirement in 3 yrs will effect pt and his family.  Pt's cousin is also a VP and a Principal in the company too.     Encouraged pt to consider  having a conversation with [name] that leads to [place] being back in her bed full time.  Pt will consider doing so and we will talk more about it next session.  Encouraged pt to continue with his self care activities and we will meet in 2 wks for a follow up session.  Interventions: Cognitive Behavioral Therapy  Diagnosis:Adjustment disorder with depressed mood  Plan: Treatment Plan Strengths/Abilities:  Intelligent, Intuitive, Willing to participate in therapy Treatment Preferences:  Outpatient Individual Therapy Statement of Needs:  Patient is to use CBT, mindfulness and coping skills to help manage and/or decrease symptoms associated with their diagnosis. Symptoms:  Depressed/Irritable mood, worry, social withdrawal Problems Addressed:  Depressive thoughts, Sadness, Sleep issues, etc. Long Term Goals:  Pt to reduce overall level, frequency, and intensity of the feelings of depression as evidenced by decreased irritability, negative self talk, and helpless feelings from 6 to 7 days/week to 0 to 1 days/week, per client report, for at least 3 consecutive months.  Progress: 30% Short Term Goals:  Pt to verbally express understanding of the relationship between feelings of depression and their impact on thinking patterns and behaviors.  Pt to verbalize an understanding of the role that distorted thinking plays in creating fears, excessive worry, and ruminations.  Progress: 30% Target Date:  09/13/2024 Frequency:  Bi-weekly Modality:  Cognitive Behavioral Therapy  Interventions by Therapist:  Therapist will use CBT, Mindfulness exercises, Coping skills and Referrals, as needed by client. Client has verbally approved this treatment plan.  Karie Kirks, [facility]

## 2023-10-19 DIAGNOSIS — Z23 Encounter for immunization: Secondary | ICD-10-CM | POA: Diagnosis not present

## 2023-10-27 ENCOUNTER — Ambulatory Visit: Admitting: Psychology

## 2023-10-27 DIAGNOSIS — F4321 Adjustment disorder with depressed mood: Secondary | ICD-10-CM | POA: Diagnosis not present

## 2023-10-27 NOTE — Progress Notes (Signed)
 [facility] Counselor/Therapist Progress Note  Patient ID: George Singleton, MRN: [id number],    Date: 10/27/2023  Time Spent:  60 minutes    start time: 1400    end time: 1500  Treatment Type: Individual Therapy  Reported Symptoms: Pt presents for session, in person in the office, granting consent for the session.   Mental Status Exam: Appearance:  Casual     Behavior: Appropriate  Motor: Normal  Speech/Language:  Clear and Coherent  Affect: Appropriate  Mood: depressed  Thought process: normal  Thought content:   WNL  Sensory/Perceptual disturbances:   WNL  Orientation: oriented to person, place, and time/date  Attention: Good  Concentration: Good  Memory: WNL  Fund of knowledge:  Good  Insight:   Good  Judgment:  Good  Impulse Control: Good   Risk Assessment: Danger to Self:  No Self-injurious Behavior: No Danger to Others: No Duty to Warn:no Physical Aggression / Violence:No  Access to Firearms a concern: No  Gang Involvement:No   Subjective: Pt shares that he "has been mostly good since last time.  George Singleton has her normal list of grievances and I am wondering how they keep coming."  Tomorrow is George Singleton's birthday and they are taking George Singleton and a friend to [facility] for the weekend.  George Singleton had issues with her blood sugars yesterday and pt was tied up in mtgs; George Singleton works from home and handled the issues; George Singleton was not happy with him not being available to her.  Pt shares that he and George Singleton have engaged with marriage counselors twice during their relationship; pt is not sure that either episode was helpful to their relationship; pt shares that [place] ended each episode.  Pt shares that he is aware that George Singleton is watching their relationship and has to be learning what a marriage is like.  Encouraged pt to think about starting a conversation with George Singleton about what they like about each other and what they think they each do well.  Pt continues to take his  Wellbutrin and will talk with Dr. Casimiro Singleton about increasing the dose.  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  Encouraged pt to talk with George Singleton about pursuing a babysitter they can both trust.  Encouraged pt to consider having a conversation with George Singleton that leads to [place] being back in her bed full time.  Encouraged pt to continue with his self care activities and we will meet in 5 wks for a follow up session, due to my vacation.  Interventions: Cognitive Behavioral Therapy  Diagnosis:Adjustment disorder with depressed mood  Plan: Treatment Plan Strengths/Abilities:  Intelligent, Intuitive, Willing to participate in therapy Treatment Preferences:  Outpatient Individual Therapy Statement of Needs:  Patient is to use CBT, mindfulness and coping skills to help manage and/or decrease symptoms associated with their diagnosis. Symptoms:  Depressed/Irritable mood, worry, social withdrawal Problems Addressed:  Depressive thoughts, Sadness, Sleep issues, etc. Long Term Goals:  Pt to reduce overall level, frequency, and intensity of the feelings of depression as evidenced by decreased irritability, negative self talk, and helpless feelings from 6 to 7 days/week to 0 to 1 days/week, per client report, for at least 3 consecutive months.  Progress: 30% Short Term Goals:  Pt to verbally express understanding of the relationship between feelings of depression and their impact on thinking patterns and behaviors.  Pt to verbalize an understanding of the role that distorted thinking plays  in creating fears, excessive worry, and ruminations.  Progress: 30% Target Date:  09/13/2024 Frequency:  Bi-weekly Modality:  Cognitive Behavioral Therapy Interventions by Therapist:  Therapist will use CBT, Mindfulness exercises, Coping skills and Referrals, as needed by client. Client has verbally approved this treatment plan.  Karie Kirks, [facility]

## 2023-11-07 ENCOUNTER — Encounter: Payer: Self-pay | Admitting: Family Medicine

## 2023-11-07 ENCOUNTER — Ambulatory Visit: Payer: BC Managed Care – PPO | Admitting: Family Medicine

## 2023-11-07 VITALS — BP 100/60 | HR 60 | Temp 97.9°F | Ht 73.0 in | Wt 197.9 lb

## 2023-11-07 DIAGNOSIS — F321 Major depressive disorder, single episode, moderate: Secondary | ICD-10-CM | POA: Diagnosis not present

## 2023-11-07 DIAGNOSIS — Z1211 Encounter for screening for malignant neoplasm of colon: Secondary | ICD-10-CM

## 2023-11-07 NOTE — Assessment & Plan Note (Signed)
 Pt is doing well on the 300 mg wellbutrin daily, no side effects reported. He wishes to continue this dose at this time. Will see him back in 6 months for his annual physical.

## 2023-11-07 NOTE — Progress Notes (Signed)
 Established Patient Office Visit  Subjective   Patient ID: George Singleton, male    DOB: 05-02-1981  Age: 43 y.o. MRN: [id number]  Chief Complaint  Patient presents with   Medical Management of Chronic Issues    Pt is here for follow up today on his adjustment/ depression disorder. He reports when he first started the medication it was working better than now. States that the 300 mg seems to be working well for him, is happy with the current dose. He denies any side effects to the medication. Has started therapy, states that it is working well for him as well.     Current Outpatient Medications  Medication Instructions   Azelastine HCl 137 MCG/SPRAY SOLN 1 - 2 puffs daily   baclofen (LIORESAL) 20 mg, Oral, Daily PRN   buPROPion (WELLBUTRIN XL) 300 mg, Oral, Daily   Calcium Carbonate-Vit D-Min (CALTRATE 600+D PLUS MINERALS) 600-800 MG-UNIT TABS 1 tablet, 2 times daily   diclofenac Sodium (VOLTAREN) 1 % GEL APPLY 2 GRAMS TO AFFECTED AREA 4 TIMES A DAY   fluticasone (FLONASE) 50 MCG/ACT nasal spray SPRAY 2 SPRAYS INTO EACH NOSTRIL EVERY DAY   ibuprofen (ADVIL) 600 mg, Oral, Daily PRN   ketotifen (ZADITOR) 0.025 % ophthalmic solution Apply to eye.   levocetirizine (XYZAL) 5 MG tablet SMARTSIG:1 Tablet(s) By Mouth Every Evening   lidocaine (LIDODERM) 5 % 1 patch, As needed   magnesium oxide (MAG-OX) 400 mg, Daily   Multiple Vitamins-Minerals (MULTIVITAMIN ADULT PO) 1 tablet, Daily   Omega-3 Fatty Acids (FISH OIL PO) 1 capsule, Daily   omeprazole (PRILOSEC) 20 MG capsule TAKE ONE CAPSULE BY MOUTH DAILY AS NEEDED (TAKE ON AN EMPTY STOMACH 30 MINUTES PRIOR TO A MEAL)   psyllium (METAMUCIL) 58.6 % powder 1 packet, Daily   tretinoin (RETIN-A) 0.05 % cream APPLY TO AFFECTED AREA EVERY DAY AT BEDTIME    Patient Active Problem List   Diagnosis Date Noted   Current moderate episode of major depressive disorder without prior episode (HCC) 08/23/2023   Chronic rhinitis 06/18/2023    Carbuncle of nose 06/18/2023   Bradycardia 07/23/2022   Chronic pain syndrome 03/19/2021   Piriformis syndrome of right side 07/21/2020   Acute pain of right knee 05/22/2020   Palpitations 03/23/2020   Finger pain, right 11/20/2019   Enthesopathy of left knee region 10/09/2019   Chronic pain of left knee 07/30/2019   Acute pain of left knee 06/25/2019   Leg length discrepancy 03/19/2019   Facet arthropathy, lumbar 02/08/2019   Lumbosacral spondylosis without myelopathy 01/26/2019   Myalgia 12/06/2018   Seasonal allergies 11/24/2018   Sacroiliac pain 10/06/2018   Right hip pain 08/25/2018   Lumbosacral radiculitis 07/19/2018   Hemorrhoids 08/30/2016   Foot pain, right 08/30/2016   S/P ablation of atrial fibrillation 02/20/2015   Hyperlipemia 02/20/2015   Chronic left-sided low back pain with left-sided sciatica 02/20/2015      Review of Systems  All other systems reviewed and are negative.     Objective:     BP 100/60   Pulse 60   Temp 97.9 F (36.6 C) (Oral)   Ht 6\' 1"  (1.854 m)   Wt 197 lb 14.4 oz (89.8 kg)   SpO2 98%   BMI 26.11 kg/m    Physical Exam Vitals reviewed.  Constitutional:      Appearance: Normal appearance. He is normal weight.  Eyes:     Conjunctiva/sclera: Conjunctivae normal.  Cardiovascular:     Rate and Rhythm:  Normal rate and regular rhythm.     Heart sounds: Normal heart sounds. No murmur heard. Pulmonary:     Effort: Pulmonary effort is normal.     Breath sounds: Normal breath sounds. No wheezing.  Neurological:     Mental Status: He is alert and oriented to person, place, and time. Mental status is at baseline.  Psychiatric:        Mood and Affect: Mood normal.        Behavior: Behavior normal.      No results found for any visits on 11/07/23.    The 10-year ASCVD risk score (Arnett DK, et al., 2019) is: 1.8%    Assessment & Plan:  Colon cancer screening -     Cologuard  Current moderate episode of major depressive  disorder without prior episode [facility]) Assessment & Plan: Pt is doing well on the 300 mg wellbutrin daily, no side effects reported. He wishes to continue this dose at this time. Will see him back in 6 months for his annual physical.       Return in about 27 weeks (around 05/14/2024) for annual physical exam.    Karie Georges, MD

## 2023-11-22 DIAGNOSIS — Z1211 Encounter for screening for malignant neoplasm of colon: Secondary | ICD-10-CM | POA: Diagnosis not present

## 2023-11-28 ENCOUNTER — Encounter: Payer: Self-pay | Admitting: Internal Medicine

## 2023-11-29 ENCOUNTER — Encounter: Payer: Self-pay | Admitting: Family Medicine

## 2023-11-29 LAB — COLOGUARD: COLOGUARD: POSITIVE — AB

## 2023-11-29 NOTE — Addendum Note (Signed)
 Addended by: Aida House on: 11/29/2023 01:47 PM   Modules accepted: Orders

## 2023-11-30 DIAGNOSIS — Z79899 Other long term (current) drug therapy: Secondary | ICD-10-CM | POA: Diagnosis not present

## 2023-12-01 LAB — LIPID PANEL
Chol/HDL Ratio: 6.7 ratio — ABNORMAL HIGH (ref 0.0–5.0)
Cholesterol, Total: 296 mg/dL — ABNORMAL HIGH (ref 100–199)
HDL: 44 mg/dL (ref >39)
LDL Chol Calc (NIH): 243 mg/dL — ABNORMAL HIGH (ref 0–99)
Triglycerides: 63 mg/dL (ref 0–149)
VLDL Cholesterol Cal: 9 mg/dL (ref 5–40)

## 2023-12-05 ENCOUNTER — Telehealth: Payer: Self-pay

## 2023-12-05 DIAGNOSIS — E785 Hyperlipidemia, unspecified: Secondary | ICD-10-CM

## 2023-12-05 NOTE — Telephone Encounter (Signed)
-----   Message from Boyce Byes sent at 12/01/2023  9:30 PM EDT ----- Cheron Corning, Please refer him to the lipid clinic.  Thanks, Harvie Liner ----- Message ----- From: Arva Lathe, RN Sent: 12/01/2023   5:21 PM EDT To: Boyce Byes, MD  Preliminarily reviewed and sent to MD for review and sign

## 2023-12-05 NOTE — Telephone Encounter (Signed)
 The patient has been notified of the result and verbalized understanding.  All questions (if any) were answered. Nereyda Bowler Chauvigne, RN 12/05/2023 4:14 PM  Referral has been placed.

## 2023-12-07 ENCOUNTER — Telehealth: Payer: Self-pay

## 2023-12-07 NOTE — Telephone Encounter (Signed)
 Left a detailed message at the patient's cell number stating the referral to GI was placed just last week.  Message left stating the referral coordinator will work on processing this through insurance, someone will contact him with appt info and it can take some time to get in for an appt.

## 2023-12-07 NOTE — Telephone Encounter (Signed)
 Copied from CRM [id number]. Topic: Referral - Status >> Dec 07, 2023 11:51 AM Luane Rumps D wrote: Reason for CRM: Patient called because he has not heard from the GI office in regards to his colonoscopy and he is reaching out for an update.

## 2023-12-09 ENCOUNTER — Ambulatory Visit (INDEPENDENT_AMBULATORY_CARE_PROVIDER_SITE_OTHER): Admitting: Psychology

## 2023-12-09 DIAGNOSIS — F4321 Adjustment disorder with depressed mood: Secondary | ICD-10-CM

## 2023-12-09 NOTE — Progress Notes (Signed)
 [facility] Counselor/Therapist Progress Note  Patient ID: George Singleton, MRN: [id number],    Date: 12/09/2023  Time Spent:  60 minutes    start time: 1100    end time: 1200  Treatment Type: Individual Therapy  Reported Symptoms: Pt presents for session, via Caregility video, granting consent for the session, stating that he is in his home with no one else present.  Pt shares that he understands the limits of virtual.  I shared with pt that I am in my office with no one else here either.  Mental Status Exam: Appearance:  Casual     Behavior: Appropriate  Motor: Normal  Speech/Language:  Clear and Coherent  Affect: Appropriate  Mood: depressed  Thought process: normal  Thought content:   WNL  Sensory/Perceptual disturbances:   WNL  Orientation: oriented to person, place, and time/date  Attention: Good  Concentration: Good  Memory: WNL  Fund of knowledge:  Good  Insight:   Good  Judgment:  Good  Impulse Control: Good   Risk Assessment: Danger to Self:  No Self-injurious Behavior: No Danger to Others: No Duty to Warn:no Physical Aggression / Violence:No  Access to Firearms a concern: No  Gang Involvement:No   Subjective: Pt shares that he "has been doing well since last time; mostly up since then.  We went on vacation to [place] and we had a great time; we were there for a week.  We got to do most everything we wanted to do.  [name] did a great job planning for [employer] diabetic needs while we were gone."   Pt shares they stayed at a nice hotel on [address], right in the middle of things and we ate good food too.  They had done a lot of research before they left about dining options.  Pt shares that work has been OK since his return from vacation and he is thankful for that.  He is having to travel for work next week (Th-Sun).  He knows that they will get busy again and he is planning for that.  His dad and his dad's partner are still involved in the  business and they need to have succession plans for them.  They are interviewing candidates and have the opportunity to wait to choose the best option.  Pt shares that [name] also enjoyed their vacation as did Pharmacist, community.  Pt shares that Renay Carota was selected to be part of a large meeting for Diabetes in [place]  in the Summer.  It is designed to solicit Congress to continue to fund Diabetes research.  They will be going to [place] this summer for this event.  Pt shares that he and [name] have been getting along pretty well since our last session.  He and [name] have been discussing the possibility of working toward getting a Publishing rights manager for Renay Carota so they can have regular social times for themselves.  Renay Carota has been sleeping back in her bed most recently; pt is hopeful that they can work to having her back all of the time in short order.  Pt  did talk with Dr. Bambi Lever about increasing the dose of his Wellbutrin ; she did increase it and pt is tolerating it pretty well.  Pt has been working with yoga and meditation as a means of self care and he knows this is beneficial for him.  Pt shares that he has recently completed a Cologuard test; those results came back abnormal and he needs a colonoscopy to follow up.  He and [name] have some anxiety related to this issue.  Talked through ways Encouraged pt to continue with his self care activities and we will meet in 2 wks for a follow up session.  Interventions: Cognitive Behavioral Therapy  Diagnosis:Adjustment disorder with depressed mood  Plan: Treatment Plan Strengths/Abilities:  Intelligent, Intuitive, Willing to participate in therapy Treatment Preferences:  Outpatient Individual Therapy Statement of Needs:  Patient is to use CBT, mindfulness and coping skills to help manage and/or decrease symptoms associated with their diagnosis. Symptoms:  Depressed/Irritable mood, worry, social withdrawal Problems Addressed:  Depressive thoughts, Sadness, Sleep  issues, etc. Long Term Goals:  Pt to reduce overall level, frequency, and intensity of the feelings of depression as evidenced by decreased irritability, negative self talk, and helpless feelings from 6 to 7 days/week to 0 to 1 days/week, per client report, for at least 3 consecutive months.  Progress: 30% Short Term Goals:  Pt to verbally express understanding of the relationship between feelings of depression and their impact on thinking patterns and behaviors.  Pt to verbalize an understanding of the role that distorted thinking plays in creating fears, excessive worry, and ruminations.  Progress: 30% Target Date:  09/13/2024 Frequency:  Bi-weekly Modality:  Cognitive Behavioral Therapy Interventions by Therapist:  Therapist will use CBT, Mindfulness exercises, Coping skills and Referrals, as needed by client. Client has verbally approved this treatment plan.  Jhonny Moss, [place]

## 2023-12-16 ENCOUNTER — Encounter: Payer: Self-pay | Admitting: Internal Medicine

## 2023-12-16 ENCOUNTER — Other Ambulatory Visit: Payer: Self-pay | Admitting: Family Medicine

## 2023-12-16 DIAGNOSIS — F321 Major depressive disorder, single episode, moderate: Secondary | ICD-10-CM

## 2023-12-21 ENCOUNTER — Ambulatory Visit (INDEPENDENT_AMBULATORY_CARE_PROVIDER_SITE_OTHER): Admitting: Psychology

## 2023-12-21 DIAGNOSIS — H1045 Other chronic allergic conjunctivitis: Secondary | ICD-10-CM | POA: Diagnosis not present

## 2023-12-21 DIAGNOSIS — J3089 Other allergic rhinitis: Secondary | ICD-10-CM | POA: Diagnosis not present

## 2023-12-21 DIAGNOSIS — F4321 Adjustment disorder with depressed mood: Secondary | ICD-10-CM

## 2023-12-21 NOTE — Progress Notes (Signed)
 [facility] Counselor/Therapist Progress Note  Patient ID: George Singleton, MRN: [id number],    Date: 12/21/2023  Time Spent:  60 minutes    start time: 1100    end time: 1200  Treatment Type: Individual Therapy  Reported Symptoms: Pt presents for session, via Caregility video, granting consent for the session, stating that he is in his office with no one else present.  Pt shares that he understands the limits of virtual.  I shared with pt that I am in my office with no one else here either.  Mental Status Exam: Appearance:  Casual     Behavior: Appropriate  Motor: Normal  Speech/Language:  Clear and Coherent  Affect: Appropriate  Mood: depressed  Thought process: normal  Thought content:   WNL  Sensory/Perceptual disturbances:   WNL  Orientation: oriented to person, place, and time/date  Attention: Good  Concentration: Good  Memory: WNL  Fund of knowledge:  Good  Insight:   Good  Judgment:  Good  Impulse Control: Good   Risk Assessment: Danger to Self:  No Self-injurious Behavior: No Danger to Others: No Duty to Warn:no Physical Aggression / Violence:No  Access to Firearms a concern: No  Gang Involvement:No   Subjective: Pt shares that he "has been doing well since last time; I was at a business conference [place] of [name], [name] from Thursday through Sunday and that was a good trip.  George Singleton makes sure that I know what I am missing when I am not home.  She normally implies, when I am gone, that she is having to do everything at home, in essence she is being a single mom for several days.  This time was not as bad."  Pt shares that the trip was beneficial from a business perspective.  Pt shares that George Singleton has been more in favor of working towards getting George Singleton back to her own bed full time.  Pt shares that they had an argument this week about a process they were going through; pt does not feel that it was a successful interaction.  Pt shares that in these  interactions both he and George Singleton "tend to push each other's buttons."  Encouraged pt to be more mindful in these interactions to ask clarifying questions and try to diffuse the situations.  Talked about ways to try to de-escalate these interactions.  Pt shares that George Singleton is amenable to helping get George Singleton (43 yo) back in her own bed; they are working toward that.  Pt continues to take his Wellbutrin  daily and believes that it is being beneficial for him.  Pt has continued to use yoga and meditation as a means of self care and he knows this is beneficial for him.  Pt shares that he has an appt for his colonoscopy on 6/18.     Talked through ways Encouraged pt to continue with his self care activities and we will meet in 2 wks for a follow up session.  Interventions: Cognitive Behavioral Therapy  Diagnosis:Adjustment disorder with depressed mood  Plan: Treatment Plan Strengths/Abilities:  Intelligent, Intuitive, Willing to participate in therapy Treatment Preferences:  Outpatient Individual Therapy Statement of Needs:  Patient is to use CBT, mindfulness and coping skills to help manage and/or decrease symptoms associated with their diagnosis. Symptoms:  Depressed/Irritable mood, worry, social withdrawal Problems Addressed:  Depressive thoughts, Sadness, Sleep issues, etc. Long Term Goals:  Pt to reduce overall level, frequency, and intensity of the feelings of depression as evidenced by decreased irritability, negative  self talk, and helpless feelings from 6 to 7 days/week to 0 to 1 days/week, per client report, for at least 3 consecutive months.  Progress: 30% Short Term Goals:  Pt to verbally express understanding of the relationship between feelings of depression and their impact on thinking patterns and behaviors.  Pt to verbalize an understanding of the role that distorted thinking plays in creating fears, excessive worry, and ruminations.  Progress: 30% Target Date:  09/13/2024 Frequency:   Bi-weekly Modality:  Cognitive Behavioral Therapy Interventions by Therapist:  Therapist will use CBT, Mindfulness exercises, Coping skills and Referrals, as needed by client. Client has verbally approved this treatment plan.  George Singleton, [facility]

## 2023-12-22 ENCOUNTER — Other Ambulatory Visit: Payer: Self-pay | Admitting: Medical Genetics

## 2023-12-26 NOTE — Progress Notes (Unsigned)
 Office Visit    Patient Name: Unique Searfoss Date of Encounter: 12/27/2023  Primary Care Provider:  Aida House, MD Primary Cardiologist:  None  Chief Complaint    Hyperlipidemia - familial  Significant Past Medical History   AF CHADS2-VASc = ; s/p ablation x 2     Allergies  Allergen Reactions   Molds & Smuts    No Known Allergies     Other reaction(s): Tachycardia    History of Present Illness    George Singleton is a 43 y.o. male patient of Dr Rodolfo Clan, in the office today to discuss options for cholesterol management.  He has familial hyperlipidemia and has tried both simvastatin and rosuvastatin .  He was able to take the rosuvastatin  for many years, but finally had to stop earlier this year because of myalgias.  He has a calcium  score of 0.   Insurance Carrier: Medical laboratory scientific officer:  [facility]   LDL Cholesterol goal:  LDL < 100  Current Medications:  none   Previously tried:  rosuvastatin , simvastatin  Family Hx: pgf had heart disease, thinks dad has the high cholesterol, mom with hypertension; sister healthy, kids 48 (healthy) and 9 (IDDM)     Social Hx: Tobacco: no Alcohol:  occasional     Diet: mostly home cooked meals, prefers natural to processed foods;  mix of vegetables, protein is fish, eggs, chicken, occasional pork or red meat     Exercise: running - about 9-10 miles per week (3-4 miles per event), 10K planned for July   Accessory Clinical Findings   Lab Results  Component Value Date   CHOL 296 (H) 11/30/2023   HDL 44 11/30/2023   LDLCALC 243 (H) 11/30/2023   TRIG 63 11/30/2023   CHOLHDL 6.7 (H) 11/30/2023    No results found for: "LIPOA"  Lab Results  Component Value Date   ALT 24 05/24/2023   AST 22 05/24/2023   ALKPHOS 43 05/24/2023   BILITOT 0.6 05/24/2023   Lab Results  Component Value Date   CREATININE 0.97 05/24/2023   BUN 19 05/24/2023   NA 141 05/24/2023   K 4.6 05/24/2023   CL 105 05/24/2023   CO2  27 05/24/2023   Lab Results  Component Value Date   HGBA1C 5.3 04/11/2018    Home Medications    Current Outpatient Medications  Medication Sig Dispense Refill   Azelastine HCl 137 MCG/SPRAY SOLN 1 - 2 puffs daily     baclofen  (LIORESAL ) 10 MG tablet Take 2 tablets (20 mg total) by mouth daily as needed for muscle spasms. 30 each 5   buPROPion  (WELLBUTRIN  XL) 300 MG 24 hr tablet Take 1 tablet (300 mg total) by mouth daily. 90 tablet 0   Calcium  Carbonate-Vit D-Min (CALTRATE 600+D PLUS MINERALS) 600-800 MG-UNIT TABS Take 1 tablet by mouth 2 (two) times a day.     diclofenac  Sodium (VOLTAREN ) 1 % GEL APPLY 2 GRAMS TO AFFECTED AREA 4 TIMES A DAY 300 g 1   fluticasone  (FLONASE ) 50 MCG/ACT nasal spray SPRAY 2 SPRAYS INTO EACH NOSTRIL EVERY DAY 48 mL 5   ibuprofen  (ADVIL ,MOTRIN ) 600 MG tablet Take 1 tablet (600 mg total) by mouth daily as needed. 30 tablet 2   ketotifen (ZADITOR) 0.025 % ophthalmic solution Apply to eye.     levocetirizine (XYZAL) 5 MG tablet SMARTSIG:1 Tablet(s) By Mouth Every Evening     lidocaine  (LIDODERM ) 5 % Place 1 patch onto the skin as needed. Remove &  Discard patch within 12 hours or as directed by MD     magnesium oxide (MAG-OX) 400 MG tablet Take 400 mg by mouth daily.     Multiple Vitamins-Minerals (MULTIVITAMIN ADULT PO) Take 1 tablet by mouth daily.      Omega-3 Fatty Acids (FISH OIL PO) Take 1 capsule by mouth daily.      omeprazole  (PRILOSEC) 20 MG capsule TAKE ONE CAPSULE BY MOUTH DAILY AS NEEDED (TAKE ON AN EMPTY STOMACH 30 MINUTES PRIOR TO A MEAL)     psyllium (METAMUCIL) 58.6 % powder Take 1 packet by mouth daily.     tretinoin (RETIN-A) 0.05 % cream APPLY TO AFFECTED AREA EVERY DAY AT BEDTIME  3   No current facility-administered medications for this visit.     Assessment & Plan    Hyperlipemia Assessment: Patient with familial hyperlipidemia not at LDL goal of < 100 Most recent LDL 243 on 11/30/23 Not able to tolerate statins secondary to  myalgias Reviewed options for lowering LDL cholesterol, including ezetimibe, PCSK-9 inhibitors, bempedoic acid and inclisiran.  Discussed mechanisms of action, dosing, side effects, potential decreases in LDL cholesterol and costs.  Also reviewed potential options for patient assistance.  Plan: Patient agreeable to starting Repatha 140 mg q14d Repeat labs after: 3 months  Lipid Liver function Patient was given information on copay card   Donivan Furry, PharmD CPP [facility] [address]  [place], [place] [address] [phone]  12/27/2023, 11:21 AM

## 2023-12-27 ENCOUNTER — Encounter: Payer: Self-pay | Admitting: Pharmacist Clinician (PhC)/ Clinical Pharmacy Specialist

## 2023-12-27 ENCOUNTER — Ambulatory Visit: Attending: Cardiology | Admitting: Pharmacist Clinician (PhC)/ Clinical Pharmacy Specialist

## 2023-12-27 ENCOUNTER — Telehealth: Payer: Self-pay | Admitting: Pharmacist Clinician (PhC)/ Clinical Pharmacy Specialist

## 2023-12-27 ENCOUNTER — Other Ambulatory Visit (HOSPITAL_COMMUNITY): Payer: Self-pay

## 2023-12-27 ENCOUNTER — Telehealth: Payer: Self-pay | Admitting: Pharmacy Technician

## 2023-12-27 DIAGNOSIS — E7801 Familial hypercholesterolemia: Secondary | ICD-10-CM

## 2023-12-27 NOTE — Assessment & Plan Note (Signed)
 Assessment: Patient with familial hyperlipidemia not at LDL goal of < 100 Most recent LDL 243 on 11/30/23 Not able to tolerate statins secondary to myalgias Reviewed options for lowering LDL cholesterol, including ezetimibe, PCSK-9 inhibitors, bempedoic acid and inclisiran.  Discussed mechanisms of action, dosing, side effects, potential decreases in LDL cholesterol and costs.  Also reviewed potential options for patient assistance.  Plan: Patient agreeable to starting Repatha 140 mg q14d Repeat labs after: 3 months  Lipid Liver function Patient was given information on copay card

## 2023-12-27 NOTE — Telephone Encounter (Signed)
 Pharmacy Patient Advocate Encounter   Received notification from Pt Calls Messages-kristin that prior authorization for repatha is required/requested.   Insurance verification completed.   The patient is insured through [facility] .   Per test claim: PA required; PA submitted to above mentioned insurance via CoverMyMeds Key/confirmation #/EOC [id number] Status is pending

## 2023-12-27 NOTE — Telephone Encounter (Signed)
 Pharmacy Patient Advocate Encounter  Received notification from [facility] that Prior Authorization for repatha has been APPROVED from 12/27/23 to 12/26/24. Ran test claim, Copay is $35... This test claim was processed through [facility]- copay amounts may vary at other pharmacies due to pharmacy/plan contracts, or as the patient moves through the different stages of their insurance plan.   PA #/Case ID/Reference #: [id number]

## 2023-12-27 NOTE — Telephone Encounter (Signed)
 Please do PA for Repatha

## 2023-12-27 NOTE — Patient Instructions (Signed)
 Your Results:             Your most recent labs Goal  Total Cholesterol 296 < 200  Triglycerides 63 < 150  HDL (happy/good cholesterol) 44 > 40  LDL (lousy/bad cholesterol 243 < 100   Medication changes:  We will start the process to get Repatha covered by your insurance.  Once the prior authorization is complete, I will call/send a MyChart message to let you know and confirm pharmacy information.   You will take one injection every 14 days  Lab orders:  We want to repeat labs after 2-3 months.  We will send you a lab order to remind you once we get closer to that time.    Patient Assistance:    Repatha Co-pay Card Instructions 1.      Go to [email or web address]  2.      Click the red "Repahta Co-Pay Card" button near the top right 3.      Scroll down and click "Yes, I have a Repatha prescription 4.      Continue to scroll down and selected "[employer]"  5.      Continue to scroll down and for the question "Are you eligible for Medicare but receiving prescription drug coverage from a former employer, union, or welfare plan?" select "No" 6.      Continue to scroll down and click the first box which is next to "Repatha Co-Pay Card, and beneath that, select "I want to enroll in the Repatha Co-Pay Card Program" 7.      Continue to scroll down until you see the "Next" button, then click it 8.      Fill out your personal information then click the "Next" button    Thank you for choosing [facility]

## 2023-12-28 MED ORDER — REPATHA SURECLICK 140 MG/ML ~~LOC~~ SOAJ: 140.0000 mg | SUBCUTANEOUS | 3 refills | Status: AC

## 2023-12-28 NOTE — Addendum Note (Signed)
 Addended by: Ikea Demicco L on: 12/28/2023 12:54 PM   Modules accepted: Orders

## 2023-12-29 ENCOUNTER — Ambulatory Visit (AMBULATORY_SURGERY_CENTER)

## 2023-12-29 ENCOUNTER — Encounter: Payer: Self-pay | Admitting: Internal Medicine

## 2023-12-29 VITALS — Ht 73.0 in | Wt 196.6 lb

## 2023-12-29 DIAGNOSIS — Z1211 Encounter for screening for malignant neoplasm of colon: Secondary | ICD-10-CM

## 2023-12-29 DIAGNOSIS — K602 Anal fissure, unspecified: Secondary | ICD-10-CM

## 2023-12-29 MED ORDER — NA SULFATE-K SULFATE-MG SULF 17.5-3.13-1.6 GM/177ML PO SOLN: 1.0000 | Freq: Once | ORAL | 0 refills | Status: AC

## 2023-12-29 NOTE — Progress Notes (Signed)
 Pre visit completed in person;  Patient verified name, DOB, and address; No egg or soy allergy known to patient; No issues known to pt with past sedation with any surgeries or procedures; Patient denies ever being told they had issues or difficulty with intubation; No FH of Malignant Hyperthermia; Pt is not on diet pills; Pt is not on home 02;  Pt is not on blood thinners;  Pt denies issues with constipation;  No A fib or A flutter currently; hx of AFIB Have any cardiac testing pending--NO Insurance verified during PV appt--- BCBS Pt can ambulate without assistance;  Pt denies use of chewing tobacco; Discussed diabetic/weight loss medication holds; Discussed NSAID holds; Checked BMI to be less than 50; Pt instructed to use Singlecare.com or GoodRx for a price reduction on prep; Patient's chart reviewed by Rogena Class CNRA prior to previsit and patient appropriate for the [facility]; Pre visit completed and red dot placed by patient's name on their procedure day (on provider's schedule);    Instructions sent to MyChart as well as printed and given to patient at time of PV appt;

## 2024-01-02 ENCOUNTER — Other Ambulatory Visit

## 2024-01-02 DIAGNOSIS — Z006 Encounter for examination for normal comparison and control in clinical research program: Secondary | ICD-10-CM

## 2024-01-06 ENCOUNTER — Ambulatory Visit: Admitting: Psychology

## 2024-01-06 DIAGNOSIS — F4321 Adjustment disorder with depressed mood: Secondary | ICD-10-CM | POA: Diagnosis not present

## 2024-01-06 NOTE — Progress Notes (Signed)
 [facility] Counselor/Therapist Progress Note  Patient ID: George Singleton, MRN: [id number],    Date: 01/06/2024  Time Spent:  60 minutes    start time: 1100    end time: 1200  Treatment Type: Individual Therapy  Reported Symptoms: Pt presents for session, via Caregility video, granting consent for the session, stating that he is in his office at home with no one else present.  Pt shares that he understands the limits of virtual.  I shared with pt that I am in my office with no one else here either.  Mental Status Exam: Appearance:  Casual     Behavior: Appropriate  Motor: Normal  Speech/Language:  Clear and Coherent  Affect: Appropriate  Mood: depressed  Thought process: normal  Thought content:   WNL  Sensory/Perceptual disturbances:   WNL  Orientation: oriented to person, place, and time/date  Attention: Good  Concentration: Good  Memory: WNL  Fund of knowledge:  Good  Insight:   Good  Judgment:  Good  Impulse Control: Good   Risk Assessment: Danger to Self:  No Self-injurious Behavior: No Danger to Others: No Duty to Warn:no Physical Aggression / Violence:No  Access to Firearms a concern: No  Gang Involvement:No   Subjective: Pt shares that he "has been doing well since last time; We have cleaning staff here at the home today and there are contractors here as well who are doing some work.  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  Pt is planning to go to [facility] with [name] and [name] in about 3 weeks; they are going to drive to [place] to visit [employer] and may visit other parks in the area.  Pt also shares that he is going to [facility] tomorrow to participate with a running club there for an event; it is affiliated with the [employer].  He shares he has trained for and run half and a couple of full marathons in his  past.  He recognizes that he needs to pay attention to his body at this age and he also uses their Peloton to exercise on as well.  Encouraged pt to continue with his running efforts.  Pt shares that they are still struggling with [name] getting back into her room/bed regularly.  Talked with pt about the emotional needs of women and the physical needs of men in the marital relationship and how to address the needs of both of them for the improvement of the relationship.  Pt continues to take his Wellbutrin  daily and believes that it is being beneficial for him.  Pt has continued to use yoga and meditation as a means of self care and he knows this is beneficial for him.  Pt shares that he has an appt for his colonoscopy on 6/18.  Talked through ways Encouraged pt to continue with his self care activities and we will meet in 2 wks for a follow up session.  Interventions: Cognitive Behavioral Therapy  Diagnosis:Adjustment disorder with depressed mood  Plan: Treatment Plan Strengths/Abilities:  Intelligent, Intuitive, Willing to participate in therapy Treatment Preferences:  Outpatient Individual Therapy Statement of Needs:  Patient is to use CBT, mindfulness and coping skills to help manage and/or decrease symptoms associated with their diagnosis. Symptoms:  Depressed/Irritable mood, worry, social withdrawal Problems Addressed:  Depressive thoughts, Sadness, Sleep issues, etc. Long Term Goals:  Pt  to reduce overall level, frequency, and intensity of the feelings of depression as evidenced by decreased irritability, negative self talk, and helpless feelings from 6 to 7 days/week to 0 to 1 days/week, per client report, for at least 3 consecutive months.  Progress: 30% Short Term Goals:  Pt to verbally express understanding of the relationship between feelings of depression and their impact on thinking patterns and behaviors.  Pt to verbalize an understanding of the role that distorted thinking plays in  creating fears, excessive worry, and ruminations.  Progress: 30% Target Date:  09/13/2024 Frequency:  Bi-weekly Modality:  Cognitive Behavioral Therapy Interventions by Therapist:  Therapist will use CBT, Mindfulness exercises, Coping skills and Referrals, as needed by client. Client has verbally approved this treatment plan.  George Singleton, [facility]

## 2024-01-11 LAB — GENECONNECT MOLECULAR SCREEN: Genetic Analysis Overall Interpretation: POSITIVE — AB

## 2024-01-12 ENCOUNTER — Other Ambulatory Visit: Payer: Self-pay | Admitting: Family Medicine

## 2024-01-12 DIAGNOSIS — F321 Major depressive disorder, single episode, moderate: Secondary | ICD-10-CM

## 2024-01-16 ENCOUNTER — Telehealth: Payer: Self-pay | Admitting: Medical Genetics

## 2024-01-16 DIAGNOSIS — E7801 Familial hypercholesterolemia: Secondary | ICD-10-CM

## 2024-01-16 NOTE — Telephone Encounter (Signed)
 George Singleton @today @ 3:00 PM  Confirmed I was speaking with [name] [id number] by using name and DOB. Genetic counseling was offered and participant is scheduled. All questions were answered, and participant was thanked for their time and support of the above study. Participant was encouraged to contact [facility] if they have any further questions or concerns.    [name], BS [place]  [facility] Clinical Research Specialist II Direct Dial: [phone]  Fax: [phone]

## 2024-01-16 NOTE — Telephone Encounter (Signed)
 [facility] Gene[facility] Positive Result Note 01/16/2024 9:41 AM  FIRST ATTEMPT: Confirmed I was speaking with George Singleton [id number] by using name and DOB. Informed participant the reason for this call is to provide results for the above study. 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 Genetic counseling was offered and participant requests a call back to schedule. All questions were answered, and participant was thanked for their time and support of the above study. Participant was encouraged to contact [facility] if they have any further questions or concerns.

## 2024-01-18 ENCOUNTER — Encounter: Payer: Self-pay | Admitting: Internal Medicine

## 2024-01-18 ENCOUNTER — Ambulatory Visit: Admitting: Internal Medicine

## 2024-01-18 VITALS — BP 106/65 | HR 60 | Temp 98.0°F | Resp 12 | Ht 73.0 in | Wt 196.6 lb

## 2024-01-18 DIAGNOSIS — D122 Benign neoplasm of ascending colon: Secondary | ICD-10-CM | POA: Diagnosis not present

## 2024-01-18 DIAGNOSIS — K635 Polyp of colon: Secondary | ICD-10-CM

## 2024-01-18 DIAGNOSIS — Z1211 Encounter for screening for malignant neoplasm of colon: Secondary | ICD-10-CM

## 2024-01-18 MED ORDER — SODIUM CHLORIDE 0.9 % IV SOLN: 500.0000 mL | Freq: Once | INTRAVENOUS | Status: DC

## 2024-01-18 NOTE — Patient Instructions (Signed)
 Handout on polyps given to patient Await pathology results Repeat colonoscopy in 7-10 years for surveillance; will be determined based off of pathology results Resume previous diet and continue present medications    YOU HAD AN ENDOSCOPIC PROCEDURE TODAY AT THE  ENDOSCOPY CENTER:   Refer to the procedure report that was given to you for any specific questions about what was found during the examination.  If the procedure report does not answer your questions, please call your gastroenterologist to clarify.  If you requested that your care partner not be given the details of your procedure findings, then the procedure report has been included in a sealed envelope for you to review at your convenience later.  YOU SHOULD EXPECT: Some feelings of bloating in the abdomen. Passage of more gas than usual.  Walking can help get rid of the air that was put into your GI tract during the procedure and reduce the bloating. If you had a lower endoscopy (such as a colonoscopy or flexible sigmoidoscopy) you may notice spotting of blood in your stool or on the toilet paper. If you underwent a bowel prep for your procedure, you may not have a normal bowel movement for a few days.  Please Note:  You might notice some irritation and congestion in your nose or some drainage.  This is from the oxygen used during your procedure.  There is no need for concern and it should clear up in a day or so.  SYMPTOMS TO REPORT IMMEDIATELY:  Following lower endoscopy (colonoscopy or flexible sigmoidoscopy):  Excessive amounts of blood in the stool  Significant tenderness or worsening of abdominal pains  Swelling of the abdomen that is new, acute  Fever of 100F or higher  For urgent or emergent issues, a gastroenterologist can be reached at any hour by calling [phone]. Do not use MyChart messaging for urgent concerns.    DIET:  We do recommend a small meal at first, but then you may proceed to your regular diet.   Drink plenty of fluids but you should avoid alcoholic beverages for 24 hours.  ACTIVITY:  You should plan to take it easy for the rest of today and you should NOT DRIVE or use heavy machinery until tomorrow (because of the sedation medicines used during the test).    FOLLOW UP: Our staff will call the number listed on your records the next business day following your procedure.  We will call around 7:15- 8:00 am to check on you and address any questions or concerns that you may have regarding the information given to you following your procedure. If we do not reach you, we will leave a message.     If any biopsies were taken you will be contacted by phone or by letter within the next 1-3 weeks.  Please call us  at [phone] if you have not heard about the biopsies in 3 weeks.    SIGNATURES/CONFIDENTIALITY: You and/or your care partner have signed paperwork which will be entered into your electronic medical record.  These signatures attest to the fact that that the information above on your After Visit Summary has been reviewed and is understood.  Full responsibility of the confidentiality of this discharge information lies with you and/or your care-partner.

## 2024-01-18 NOTE — Progress Notes (Signed)
 Report to PACU, RN, vss, BBS= Clear.

## 2024-01-18 NOTE — Op Note (Signed)
 [facility] Patient Name: George Singleton Procedure Date: 01/18/2024 3:41 PM MRN: [id number] Endoscopist: Murel Arlington. Elvin Hammer , MD, [id number] Age: 43 Referring MD:  Date of Birth: 27-Apr-1981 Gender: Male Account #: [id number] Procedure:                Colonoscopy with cold snare polypectomy x 2 Indications:              Screening for colorectal malignant neoplasm Medicines:                Monitored Anesthesia Care Procedure:                Pre-Anesthesia Assessment:                           - Prior to the procedure, a History and Physical                            was performed, and patient medications and                            allergies were reviewed. The patient's tolerance of                            previous anesthesia was also reviewed. The risks                            and benefits of the procedure and the sedation                            options and risks were discussed with the patient.                            All questions were answered, and informed consent                            was obtained. Prior Anticoagulants: The patient has                            taken no anticoagulant or antiplatelet agents. ASA                            Grade Assessment: I - A normal, healthy patient.                            After reviewing the risks and benefits, the patient                            was deemed in satisfactory condition to undergo the                            procedure.                           After obtaining informed consent, the colonoscope  was passed under direct vision. Throughout the                            procedure, the patient's blood pressure, pulse, and                            oxygen saturations were monitored continuously. The                            Olympus Scope SN: [id number] was introduced through                            the anus and advanced to the the cecum, identified                             by appendiceal orifice and ileocecal valve. The                            ileocecal valve, appendiceal orifice, and rectum                            were photographed. The quality of the bowel                            preparation was excellent. The colonoscopy was                            performed without difficulty. The patient tolerated                            the procedure well. The bowel preparation used was                            SUPREP via split dose instruction. Scope In: 3:57:54 PM Scope Out: 4:14:30 PM Scope Withdrawal Time: 0 hours 13 minutes 18 seconds  Total Procedure Duration: 0 hours 16 minutes 36 seconds  Findings:                 Two sessile polyps were found in the ascending                            colon. The polyps were 3 to 5 mm in size. These                            polyps were removed with a cold snare. Resection                            and retrieval were complete.                           The exam was otherwise without abnormality on                            direct and retroflexion views.  Complications:            No immediate complications. Estimated blood loss:                            None. Estimated Blood Loss:     Estimated blood loss: none. Impression:               - Two 3 to 5 mm polyps in the ascending colon,                            removed with a cold snare. Resected and retrieved.                           - The examination was otherwise normal on direct                            and retroflexion views. Recommendation:           - Repeat colonoscopy in 7-10 years for surveillance.                           - Patient has a contact number available for                            emergencies. The signs and symptoms of potential                            delayed complications were discussed with the                            patient. Return to normal activities tomorrow.                            Written discharge  instructions were provided to the                            patient.                           - Resume previous diet.                           - Continue present medications.                           - Await pathology results. Murel Arlington. Elvin Hammer, MD 01/18/2024 4:19:32 PM This report has been signed electronically.

## 2024-01-18 NOTE — Progress Notes (Signed)
 Called to room to assist during endoscopic procedure.  Patient ID and intended procedure confirmed with present staff. Received instructions for my participation in the procedure from the performing physician.
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
## 2024-01-19 ENCOUNTER — Telehealth: Payer: Self-pay | Admitting: *Deleted

## 2024-01-19 ENCOUNTER — Ambulatory Visit: Admitting: Psychology

## 2024-01-19 DIAGNOSIS — F4321 Adjustment disorder with depressed mood: Secondary | ICD-10-CM | POA: Diagnosis not present

## 2024-01-19 NOTE — Telephone Encounter (Signed)
  Follow up Call-     01/18/2024    2:59 PM  Call back number  Post procedure Call Back phone  # [phone]  Permission to leave phone message Yes     Patient questions:  Do you have a fever, pain , or abdominal swelling? No. Pain Score  0 *  Have you tolerated food without any problems? Yes.    Have you been able to return to your normal activities? Yes.    Do you have any questions about your discharge instructions: Diet   No. Medications  No. Follow up visit  No.  Do you have questions or concerns about your Care? No.  Actions: * If pain score is 4 or above: No action needed, pain <4.

## 2024-01-19 NOTE — Progress Notes (Signed)
 [facility] Counselor/Therapist Progress Note  Patient ID: [place] Singleton, MRN: [id number],    Date: 01/19/2024  Time Spent:  60 minutes    start time: 1100    end time: 1200  Treatment Type: Individual Therapy  Reported Symptoms: Pt presents for session, in person, in the office, granting consent for the session.  Mental Status Exam: Appearance:  Casual     Behavior: Appropriate  Motor: Normal  Speech/Language:  Clear and Coherent  Affect: Appropriate  Mood: depressed  Thought process: normal  Thought content:   WNL  Sensory/Perceptual disturbances:   WNL  Orientation: oriented to person, place, and time/date  Attention: Good  Concentration: Good  Memory: WNL  Fund of knowledge:  Good  Insight:   Good  Judgment:  Good  Impulse Control: Good   Risk Assessment: Danger to Self:  No Self-injurious Behavior: No Danger to Others: No Duty to Warn:no Physical Aggression / Violence:No  Access to Firearms a concern: No  Gang Involvement:No   Subjective: Pt shares that he has been doing well since last time; I had my first colonoscopy yesterday and the doctor removed two polyps and sent them off for evaluation.  Hopefully those results will be good.  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  Pt's company has projects with [place] as well; that process has gotten more cumbersome with the new administration.  Pt shares that [place] is out of school for the summer and she is already doing a VBS this week; they are going to [place] next week for vacation.  The week after that they are going to [place] for three days for the juvenile diabetes group.  He also has his mom's 75th birthday celebration; pt's sister (still in the [employer]) will be there and he is not looking forward to dealing with her at that event.  Pt has been estranged from his sister for a number  of years.  She is a professor at [place] again at this point in her career.  Pt shares that [place] is distant from pt's parents.  Pt will be going to [employer] for his mom's birthday party and will see his sister (4 yrs younger than pt) there.  Encouraged pt to continue to engage with [place] in conversations and try to encourage [place] to work towards a better relationship between the two of them.  Pt continues to pay attention to his body at this age and he also uses their Peloton to exercise on as well.  Encouraged pt to continue with his running efforts.   Pt continues to take his Wellbutrin  daily and believes that it is being beneficial for him.  Pt has continued to use yoga and meditation as a means of self care and he knows this is beneficial for him.  Encouraged pt to continue with his self care activities and we will meet in 2 wks for a follow up session.  Interventions: Cognitive Behavioral Therapy  Diagnosis:Adjustment disorder with depressed mood  Plan: Treatment Plan Strengths/Abilities:  Intelligent, Intuitive, Willing to participate in therapy Treatment Preferences:  Outpatient Individual Therapy Statement of Needs:  Patient is to use CBT, mindfulness and coping skills to help manage and/or decrease symptoms associated with their diagnosis. Symptoms:  Depressed/Irritable mood, worry, social withdrawal Problems Addressed:  Depressive thoughts, Sadness, Sleep issues, etc. Long Term Goals:  Pt  to reduce overall level, frequency, and intensity of the feelings of depression as evidenced by decreased irritability, negative self talk, and helpless feelings from 6 to 7 days/week to 0 to 1 days/week, per client report, for at least 3 consecutive months.  Progress: 30% Short Term Goals:  Pt to verbally express understanding of the relationship between feelings of depression and their impact on thinking patterns and behaviors.  Pt to verbalize an understanding of the role that distorted thinking  plays in creating fears, excessive worry, and ruminations.  Progress: 30% Target Date:  09/13/2024 Frequency:  Bi-weekly Modality:  Cognitive Behavioral Therapy Interventions by Therapist:  Therapist will use CBT, Mindfulness exercises, Coping skills and Referrals, as needed by client. Client has verbally approved this treatment plan.  [place], [facility]

## 2024-01-23 LAB — SURGICAL PATHOLOGY

## 2024-01-24 ENCOUNTER — Ambulatory Visit: Payer: Self-pay | Admitting: Internal Medicine

## 2024-01-31 ENCOUNTER — Encounter: Payer: Self-pay | Admitting: Genetic Counselor

## 2024-01-31 ENCOUNTER — Ambulatory Visit: Admitting: Genetic Counselor

## 2024-01-31 DIAGNOSIS — E7801 Familial hypercholesterolemia: Secondary | ICD-10-CM
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
## 2024-02-01 ENCOUNTER — Ambulatory Visit: Admitting: Psychology

## 2024-02-01 DIAGNOSIS — F4321 Adjustment disorder with depressed mood: Secondary | ICD-10-CM

## 2024-02-01 NOTE — Progress Notes (Signed)
 [facility] Counselor/Therapist Progress Note  Patient ID: George Singleton, MRN: [id number],    Date: 02/01/2024  Time Spent:  60 minutes    start time: 1500    end time: 1600  Treatment Type: Individual Therapy  Reported Symptoms: Pt presents for session, via Caregility video, granting consent for the session.  Pt shares that he is in his office with no one else present and that he understands the limits of virtual sessions.  I shared with pt that I am in my office with no one else here either.  Mental Status Exam: Appearance:  Casual     Behavior: Appropriate  Motor: Normal  Speech/Language:  Clear and Coherent  Affect: Appropriate  Mood: depressed  Thought process: normal  Thought content:   WNL  Sensory/Perceptual disturbances:   WNL  Orientation: oriented to person, place, and time/date  Attention: Good  Concentration: Good  Memory: WNL  Fund of knowledge:  Good  Insight:   Good  Judgment:  Good  Impulse Control: Good   Risk Assessment: Danger to Self:  No Self-injurious Behavior: No Danger to Others: No Duty to Warn:no Physical Aggression / Violence:No  Access to Firearms a concern: No  Gang Involvement:No   Subjective: Pt shares that he has been doing well since last time.  I shared with pt that I saw a local news story about [name] being invited to [place] conference next week.  He did not know about the story and will Google it.  [name], pt, and [name] are going to [place] for the conference on Monday of next week.  Pt is aware of many Type 1 diabetics who are doing great things.  They will come back on Thursday and he goes to [place] on Friday for his mom's birthday party.  He shares that the family had a nice time on their vacation to [place] last week; there were some stressful parts to the trip but overall, it was good.  Pt shares that [name] has started seeing her own therapist and pt is happy about that.  Brianna slept in her own bed several nights  lately.  [name] has been laid off, as of the end of August and has a couple of months severance pay.  Her company ([employer] owned by [employer]) is re-organizing the company.  [name] has an ear issue and is scheduled for an MRI in 2 wks; she will follow up with an ENT after that.  Pt is working to be as supportive of [name] as he can be.  Pt shares his polyps were deemed to be benign; his follow up test will be in 7 yrs.  Pt has a history of Afib and high cholesterol; he has been on a statin since the age of 43 yo.  Pt continues to take his Wellbutrin  daily and believes that it is being beneficial for him.  Pt has continued to use yoga and meditation as a means of self care and he knows this is beneficial for him.  Encouraged pt to continue with his self care activities and we will meet in 2 wks for a follow up session.  Interventions: Cognitive Behavioral Therapy  Diagnosis:Adjustment disorder with depressed mood  Plan: Treatment Plan Strengths/Abilities:  Intelligent, Intuitive, Willing to participate in therapy Treatment Preferences:  Outpatient Individual Therapy Statement of Needs:  Patient is to use CBT, mindfulness and coping skills to help manage and/or decrease symptoms associated with their diagnosis. Symptoms:  Depressed/Irritable mood, worry, social withdrawal Problems  Addressed:  Depressive thoughts, Sadness, Sleep issues, etc. Long Term Goals:  Pt to reduce overall level, frequency, and intensity of the feelings of depression as evidenced by decreased irritability, negative self talk, and helpless feelings from 6 to 7 days/week to 0 to 1 days/week, per client report, for at least 3 consecutive months.  Progress: 30% Short Term Goals:  Pt to verbally express understanding of the relationship between feelings of depression and their impact on thinking patterns and behaviors.  Pt to verbalize an understanding of the role that distorted thinking plays in creating fears, excessive  worry, and ruminations.  Progress: 30% Target Date:  09/13/2024 Frequency:  Bi-weekly Modality:  Cognitive Behavioral Therapy Interventions by Therapist:  Therapist will use CBT, Mindfulness exercises, Coping skills and Referrals, as needed by client. Client has verbally approved this treatment plan.  Francis KATHEE Macintosh, [facility]

## 2024-02-13 ENCOUNTER — Encounter (HOSPITAL_BASED_OUTPATIENT_CLINIC_OR_DEPARTMENT_OTHER): Payer: Self-pay | Admitting: Internal Medicine

## 2024-02-13 ENCOUNTER — Ambulatory Visit (HOSPITAL_BASED_OUTPATIENT_CLINIC_OR_DEPARTMENT_OTHER): Admitting: Internal Medicine

## 2024-02-13 VITALS — BP 122/78 | HR 84 | Ht 73.0 in | Wt 205.0 lb

## 2024-02-13 DIAGNOSIS — T466X5D Adverse effect of antihyperlipidemic and antiarteriosclerotic drugs, subsequent encounter: Secondary | ICD-10-CM | POA: Diagnosis not present

## 2024-02-13 DIAGNOSIS — M791 Myalgia, unspecified site: Secondary | ICD-10-CM | POA: Diagnosis not present

## 2024-02-13 DIAGNOSIS — E785 Hyperlipidemia, unspecified: Secondary | ICD-10-CM | POA: Diagnosis not present

## 2024-02-13 DIAGNOSIS — E7801 Familial hypercholesterolemia: Secondary | ICD-10-CM
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
## 2024-02-13 NOTE — Patient Instructions (Addendum)
 Medication Instructions:  Your physician recommends that you continue on your current medications as directed. Please refer to the Current Medication list given to you today.  *If you need a refill on your cardiac medications before your next appointment, please call your pharmacy*  Follow-Up: At [facility], you and your health needs are our priority.  As part of our continuing mission to provide you with exceptional heart care, our providers are all part of one team.  This team includes your primary Cardiologist (physician) and Advanced Practice Providers or APPs (Physician Assistants and Nurse Practitioners) who all work together to provide you with the care you need, when you need it.  Your next appointment:   AS needed with Dr. Fernande

## 2024-02-14 ENCOUNTER — Encounter: Payer: Self-pay | Admitting: Family Medicine

## 2024-02-15 ENCOUNTER — Telehealth: Admitting: Family Medicine

## 2024-02-15 ENCOUNTER — Encounter: Payer: Self-pay | Admitting: Family Medicine

## 2024-02-15 VITALS — Ht 73.0 in

## 2024-02-15 DIAGNOSIS — R051 Acute cough: Secondary | ICD-10-CM

## 2024-02-15 DIAGNOSIS — U071 COVID-19: Secondary | ICD-10-CM

## 2024-02-15 MED ORDER — BENZONATATE 100 MG PO CAPS: 100.0000 mg | ORAL_CAPSULE | Freq: Two times a day (BID) | ORAL | 0 refills | Status: AC | PRN

## 2024-02-15 MED ORDER — NIRMATRELVIR/RITONAVIR (PAXLOVID)TABLET: 3.0000 | ORAL_TABLET | Freq: Two times a day (BID) | ORAL | 0 refills | Status: AC
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
## 2024-02-15 NOTE — Telephone Encounter (Signed)
 I called the patient and scheduled a virtual visit with Dr [place] today at 10:30am as PCP does not have any openings.

## 2024-02-23 ENCOUNTER — Ambulatory Visit: Admitting: Psychology

## 2024-02-23 DIAGNOSIS — F4321 Adjustment disorder with depressed mood: Secondary | ICD-10-CM | POA: Diagnosis not present

## 2024-02-23 NOTE — Progress Notes (Signed)
 [facility] Counselor/Therapist Progress Note  Patient ID: George Singleton, MRN: [id number],    Date: 02/23/2024  Time Spent:  60 minutes    start time: 1500    end time: 1600  Treatment Type: Individual Therapy  Reported Symptoms: Pt presents for session, via Caregility video, granting consent for the session.  Pt shares that he is in his office with no one else present and that he understands the limits of virtual sessions.  I shared with pt that I am in my office with no one else here either.  Mental Status Exam: Appearance:  Casual     Behavior: Appropriate  Motor: Normal  Speech/Language:  Clear and Coherent  Affect: Appropriate  Mood: depressed  Thought process: normal  Thought content:   WNL  Sensory/Perceptual disturbances:   WNL  Orientation: oriented to person, place, and time/date  Attention: Good  Concentration: Good  Memory: WNL  Fund of knowledge:  Good  Insight:   Good  Judgment:  Good  Impulse Control: Good   Risk Assessment: Danger to Self:  No Self-injurious Behavior: No Danger to Others: No Duty to Warn:no Physical Aggression / Violence:No  Access to Firearms a concern: No  Gang Involvement:No   Subjective: Pt shares that he has been doing OK since last time.  We went to [place] to do the Juvenile Diabetes event and there were some things I was able to do; I was primarily the back up to [place] because space was limited.  It turned out to be a really nice event.  [name] and [name] enjoyed the event as well.  Pt shares that he went to [place] to celebrate his mom's birthday the Friday after they returned from [place]; he came back home and had to go back to [place] the following Tuesday in [place] for a presentation and that went well and the company got the the job which will be a long term engagement, lasting between 5-10 yrs.  Pt shares that he and [name] have had several difficult conversations lately.  They agreed that where they are now  is not where they want to be but they do not know how to get themselves out of the place they are in.  She told me that I have been more negative lately.  Amanda is also applying for new jobs and that is anxiety producing for her.  Pt shares that he, [name], and [place] all got COVID after they got back from [place].  Talked with pt about creating common ground with [name] where they both agree to accept that their partner will have the best of intentions for each other, acknowledging that this will be hard for each of them.  [name] is currently looking for a new job; her current job ends in August and then she gets 2 months severance.  Pt shares that [name] has started seeing her own therapist and pt is happy about that; as part of that process, pt and [name] have had to agree to go to family therapy with [place].  [name] did have her MRI about her ear pain but she has not met with the ENT yet.  Pt continues to take his Wellbutrin  daily and believes that it is being beneficial for him.  Pt has continued to use yoga and meditation as a means of self care and he knows this is beneficial for him.  Encouraged pt to continue with his self care activities and we will meet in 2 wks for a follow  up session.  Interventions: Cognitive Behavioral Therapy  Diagnosis:Adjustment disorder with depressed mood  Plan: Treatment Plan Strengths/Abilities:  Intelligent, Intuitive, Willing to participate in therapy Treatment Preferences:  Outpatient Individual Therapy Statement of Needs:  Patient is to use CBT, mindfulness and coping skills to help manage and/or decrease symptoms associated with their diagnosis. Symptoms:  Depressed/Irritable mood, worry, social withdrawal Problems Addressed:  Depressive thoughts, Sadness, Sleep issues, etc. Long Term Goals:  Pt to reduce overall level, frequency, and intensity of the feelings of depression as evidenced by decreased irritability, negative self talk, and helpless feelings from 6 to  7 days/week to 0 to 1 days/week, per client report, for at least 3 consecutive months.  Progress: 30% Short Term Goals:  Pt to verbally express understanding of the relationship between feelings of depression and their impact on thinking patterns and behaviors.  Pt to verbalize an understanding of the role that distorted thinking plays in creating fears, excessive worry, and ruminations.  Progress: 30% Target Date:  09/13/2024 Frequency:  Bi-weekly Modality:  Cognitive Behavioral Therapy Interventions by Therapist:  Therapist will use CBT, Mindfulness exercises, Coping skills and Referrals, as needed by client. Client has verbally approved this treatment plan.  Francis KATHEE Macintosh, [facility]

## 2024-03-09 ENCOUNTER — Ambulatory Visit: Admitting: Psychology

## 2024-03-09 DIAGNOSIS — F4321 Adjustment disorder with depressed mood: Secondary | ICD-10-CM

## 2024-03-09 NOTE — Progress Notes (Signed)
 [name] Counselor/Therapist Progress Note  Patient ID: George Singleton, MRN: [id number],    Date: 03/09/2024  Time Spent:  60 minutes    start time: 1000    end time: 1100  Treatment Type: Individual Therapy  Reported Symptoms: Pt presents for session, via Caregility video, granting consent for the session.  Pt shares that he is in his office with no one else present and that he understands the limits of virtual sessions.  I shared with pt that I am in my office with no one else here either.  Mental Status Exam: Appearance:  Casual     Behavior: Appropriate  Motor: Normal  Speech/Language:  Clear and Coherent  Affect: Appropriate  Mood: depressed  Thought process: normal  Thought content:   WNL  Sensory/Perceptual disturbances:   WNL  Orientation: oriented to person, place, and time/date  Attention: Good  Concentration: Good  Memory: WNL  Fund of knowledge:  Good  Insight:   Good  Judgment:  Good  Impulse Control: Good   Risk Assessment: Danger to Self:  No Self-injurious Behavior: No Danger to Others: No Duty to Warn:no Physical Aggression / Violence:No  Access to Firearms a concern: No  Gang Involvement:No   Subjective: Pt shares that he has been doing pretty well since last time.  There has been a lot going on at home and at work, with some drama in both places.  George Singleton and I had a blow out conversation about needs and unmet needs.  Pt shares that [place] asked what she could do for him.  He told her that he wanted them to be back in their bedroom with George Singleton in her room.  Pt shares that George Singleton is 9 yrs older than pt and pt wonders if her sex drive is less than pt's at this point.  Pt shares that they all went to the family counseling session this past Monday with George Singleton's therapist and it came up again that pt does not engage as much with George Singleton's diabetes care.  Pt shares that mostly that is because George Singleton often insists that she do it because she is  a Engineer, civil (consulting) and wants it done right.  When he offers to do it, he feels that George Singleton is judging his performance.  He feels like he can't win in this situation; he did the change last night and has offered to do it again tonight.  George Singleton is continuing to look for a new job; her current job ends in August and then she gets 2 months severance.  She has had a couple of interviews and one of those has gone pretty well.  George Singleton did have her MRI about her ear pain but she has not met with the ENT yet.  Pt continues to take his Wellbutrin  daily and believes that it is being beneficial for him.  Pt has continued to use yoga and meditation as a means of self care and he knows this is beneficial for him.  Encouraged pt to continue with his self care activities and we will meet in 2 wks for a follow up session.  Interventions: Cognitive Behavioral Therapy  Diagnosis:Adjustment disorder with depressed mood  Plan: Treatment Plan Strengths/Abilities:  Intelligent, Intuitive, Willing to participate in therapy Treatment Preferences:  Outpatient Individual Therapy Statement of Needs:  Patient is to use CBT, mindfulness and coping skills to help manage and/or decrease symptoms associated with their diagnosis. Symptoms:  Depressed/Irritable mood, worry, social withdrawal Problems Addressed:  Depressive thoughts, Sadness, Sleep  issues, etc. Long Term Goals:  Pt to reduce overall level, frequency, and intensity of the feelings of depression as evidenced by decreased irritability, negative self talk, and helpless feelings from 6 to 7 days/week to 0 to 1 days/week, per client report, for at least 3 consecutive months.  Progress: 30% Short Term Goals:  Pt to verbally express understanding of the relationship between feelings of depression and their impact on thinking patterns and behaviors.  Pt to verbalize an understanding of the role that distorted thinking plays in creating fears, excessive worry, and ruminations.  Progress:  30% Target Date:  09/13/2024 Frequency:  Bi-weekly Modality:  Cognitive Behavioral Therapy Interventions by Therapist:  Therapist will use CBT, Mindfulness exercises, Coping skills and Referrals, as needed by client. Client has verbally approved this treatment plan.  Francis KATHEE Macintosh, [facility]

## 2024-03-19 ENCOUNTER — Encounter: Payer: Self-pay | Admitting: Family Medicine

## 2024-03-19 ENCOUNTER — Telehealth: Payer: Self-pay

## 2024-03-19 ENCOUNTER — Other Ambulatory Visit (HOSPITAL_COMMUNITY): Payer: Self-pay

## 2024-03-19 DIAGNOSIS — F321 Major depressive disorder, single episode, moderate: Secondary | ICD-10-CM

## 2024-03-19 MED ORDER — BUPROPION HCL ER (XL) 450 MG PO TB24
450.0000 mg | ORAL_TABLET | Freq: Every day | ORAL | 0 refills | Status: DC
Start: 2024-03-19 — End: 2024-04-04

## 2024-03-19 NOTE — Telephone Encounter (Signed)
 Pharmacy Patient Advocate Encounter   Received notification from CoverMyMeds that prior authorization for buPROPion  HCl ER (XL) 450MG  er tablets  is required/requested.   Insurance verification completed.   The patient is insured through [facility] .   Per test claim: PA required; PA submitted to above mentioned insurance via Latent Key/confirmation #/EOC [id number] Status is pending

## 2024-03-20 ENCOUNTER — Other Ambulatory Visit (HOSPITAL_BASED_OUTPATIENT_CLINIC_OR_DEPARTMENT_OTHER): Payer: Self-pay | Admitting: *Deleted

## 2024-03-20 DIAGNOSIS — E7801 Familial hypercholesterolemia: Secondary | ICD-10-CM

## 2024-03-21 LAB — LIPID PANEL
Chol/HDL Ratio: 3.3 ratio (ref 0.0–5.0)
Cholesterol, Total: 171 mg/dL (ref 100–199)
HDL: 52 mg/dL (ref >39)
LDL Chol Calc (NIH): 105 mg/dL — ABNORMAL HIGH (ref 0–99)
Triglycerides: 73 mg/dL (ref 0–149)
VLDL Cholesterol Cal: 14 mg/dL (ref 5–40)

## 2024-03-22 ENCOUNTER — Ambulatory Visit: Admitting: Psychology

## 2024-03-22 DIAGNOSIS — F4321 Adjustment disorder with depressed mood: Secondary | ICD-10-CM | POA: Diagnosis not present

## 2024-03-22 NOTE — Progress Notes (Signed)
 [facility] Counselor/Therapist Progress Note  Patient ID: Edan Juday, MRN: [id number],    Date: 03/22/2024  Time Spent:  60 minutes    start time: 1400    end time: 1500  Treatment Type: Individual Therapy  Reported Symptoms: Pt presents for session, in person in the office, granting consent for the session.  Mental Status Exam: Appearance:  Casual     Behavior: Appropriate  Motor: Normal  Speech/Language:  Clear and Coherent  Affect: Appropriate  Mood: depressed  Thought process: normal  Thought content:   WNL  Sensory/Perceptual disturbances:   WNL  Orientation: oriented to person, place, and time/date  Attention: Good  Concentration: Good  Memory: WNL  Fund of knowledge:  Good  Insight:   Good  Judgment:  Good  Impulse Control: Good   Risk Assessment: Danger to Self:  No Self-injurious Behavior: No Danger to Others: No Duty to Warn:no Physical Aggression / Violence:No  Access to Firearms a concern: No  Gang Involvement:No   Subjective: Pt shares that he work has gotten real busy real fast.  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  We also have Open House this afternoon for [name] at school; she will have two male teachers in her fourth grade class this year.  Pt shares that [name] will be in the same elementary school this year and has her group of friends.  Pt shares that he continues to exercise regularly and he is reading for pleasure.  [name] continues to look for a new job and that is stressful for her.  Talked with pt about the option that losing her job is making [name] feel less than and so she is overcompensating and trying to over control other areas of their lives.  [name] is finally back in her bed but they are staying in her room until [name] falls asleep.  This issue has not come up in family therapy yet.  Pt shares that [name] brought up in family  therapy that pt has done something that has caused her not to be able to trust him, referring to her catching pt masturbating.   Pt continues to take his Wellbutrin  daily and believes that it is being beneficial for him.  Pt has continued to use yoga and meditation as a means of self care and he knows this is beneficial for him.  Encouraged pt to continue with his self care activities and we will meet in 4 wks for a follow up session, due to my vacation.  Interventions: Cognitive Behavioral Therapy  Diagnosis:Adjustment disorder with depressed mood  Plan: Treatment Plan Strengths/Abilities:  Intelligent, Intuitive, Willing to participate in therapy Treatment Preferences:  Outpatient Individual Therapy Statement of Needs:  Patient is to use CBT, mindfulness and coping skills to help manage and/or decrease symptoms associated with their diagnosis. Symptoms:  Depressed/Irritable mood, worry, social withdrawal Problems Addressed:  Depressive thoughts, Sadness, Sleep issues, etc. Long Term Goals:  Pt to reduce overall level, frequency, and intensity of the feelings of depression as evidenced by decreased irritability, negative self talk, and helpless feelings from 6 to 7 days/week to 0 to 1 days/week, per client report, for at least 3 consecutive months.  Progress: 30% Short Term Goals:  Pt to verbally express understanding of the relationship between feelings of depression and their impact on thinking patterns and behaviors.  Pt to verbalize an understanding of  the role that distorted thinking plays in creating fears, excessive worry, and ruminations.  Progress: 30% Target Date:  09/13/2024 Frequency:  Bi-weekly Modality:  Cognitive Behavioral Therapy Interventions by Therapist:  Therapist will use CBT, Mindfulness exercises, Coping skills and Referrals, as needed by client. Client has verbally approved this treatment plan.  Francis KATHEE Macintosh, [facility]

## 2024-03-23 ENCOUNTER — Ambulatory Visit: Payer: Self-pay | Admitting: Pharmacist Clinician (PhC)/ Clinical Pharmacy Specialist

## 2024-03-26 ENCOUNTER — Other Ambulatory Visit (HOSPITAL_COMMUNITY): Payer: Self-pay

## 2024-03-26 NOTE — Telephone Encounter (Signed)
Additional information has been requested from the patient's insurance in order to proceed with the prior authorization request. Requested information has been sent, or form has been filled out and faxed back to [phone]

## 2024-03-27 ENCOUNTER — Other Ambulatory Visit (HOSPITAL_COMMUNITY): Payer: Self-pay

## 2024-03-27 NOTE — Telephone Encounter (Signed)
 Noted

## 2024-03-28 ENCOUNTER — Other Ambulatory Visit (HOSPITAL_COMMUNITY): Payer: Self-pay

## 2024-03-28 NOTE — Telephone Encounter (Signed)
 Pharmacy Patient Advocate Encounter  Received notification from [facility] that Prior Authorization for buPROPion  HCl ER (XL) 450MG  er tablets   has been DENIED. NO DENIAL reason No denial letter attached in CMM. Will attach denial letter to Media tab once received.   PA #/Case ID/Reference #: [id number]

## 2024-03-29 NOTE — Telephone Encounter (Signed)
 It looks like they were denied because there wasn't a visit documenting the medical necessity. Please call the patient to let him know that [employer] needs a face to face visit to document why he needs the higher dose. Ok to schedule a video visit for this.

## 2024-04-04 ENCOUNTER — Telehealth: Admitting: Family Medicine

## 2024-04-04 DIAGNOSIS — F321 Major depressive disorder, single episode, moderate: Secondary | ICD-10-CM

## 2024-04-04 MED ORDER — BUPROPION HCL ER (XL) 450 MG PO TB24: 450.0000 mg | ORAL_TABLET | Freq: Every day | ORAL | 1 refills | Status: AC

## 2024-04-04 NOTE — Assessment & Plan Note (Signed)
 Patient is having more stress/ anxiety/ depression despite being on the 300 mg dose of wellbutrin . He is no longer getting the effect from the medication that he was previously getting. It is medically necessary to increase the dose of the wellbutrin , will see him back in 1 month for re-evaluation/ annual physical.
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
## 2024-04-06 ENCOUNTER — Telehealth: Payer: Self-pay | Admitting: Internal Medicine

## 2024-04-06 NOTE — Telephone Encounter (Signed)
 Called pt in regards to f/u OV.   Advised pt f/u with Dr. Mona would be based on results of lab work.  Kristin, [facility] reviewed labs reports cholesterol labs look great.  Will send a message to see when or if pt needs f/u OV.   Advised pt Dr. Fernande has retired and I will send message to EP Scheduler to establish care with a new provider.   Pt had no further questions or concerns.

## 2024-04-06 NOTE — Telephone Encounter (Signed)
 Patient was confused about his follow up with Dr. Mona and Dr. Fernande. Upon looking at his previous follow up with Dr. Fernande, he was supposed to return in January 2026. Patient saw Dr. Mona at the [facility] clinic in July 2025 and it stated  to follow up as needed with Dr. Fernande. Please advise.

## 2024-04-09 NOTE — Telephone Encounter (Signed)
 Information has been sent to clinical pharmacist for appeals review. It may take 5-7 days to prepare the necessary documentation to request the appeal from the insurance.

## 2024-04-10 ENCOUNTER — Telehealth: Payer: Self-pay | Admitting: Pharmacist

## 2024-04-10 NOTE — Telephone Encounter (Signed)
 An E-Appeal has been submitted. Will advise when response is received, please be advised that most companies may take 30 days to make a decision.  Appeal letter and supporting documentation have been uploaded and submitted via CMM website on 04/10/2024 @3 :17 pm.  Thank you, Devere Pandy, PharmD Clinical Pharmacist  [place]  Direct Dial: [phone]

## 2024-04-11 ENCOUNTER — Telehealth: Payer: Self-pay | Admitting: *Deleted

## 2024-04-11 NOTE — Telephone Encounter (Signed)
 Patient's spouse requested a letter for the Covid vaccine via her personal Mychart and message sent stating this was left at the front desk.

## 2024-04-12 ENCOUNTER — Other Ambulatory Visit (HOSPITAL_COMMUNITY): Payer: Self-pay

## 2024-04-19 ENCOUNTER — Ambulatory Visit: Admitting: Psychology

## 2024-04-19 DIAGNOSIS — F4321 Adjustment disorder with depressed mood: Secondary | ICD-10-CM | POA: Diagnosis not present

## 2024-04-19 NOTE — Progress Notes (Signed)
 [facility] Counselor/Therapist Progress Note  Patient ID: George Singleton, MRN: [id number],    Date: 04/19/2024  Time Spent:  60 minutes    start time: 1400    end time: 1500  Treatment Type: Individual Therapy  Reported Symptoms: Pt presents for session, in person in the office, granting consent for the session.  Mental Status Exam: Appearance:  Casual     Behavior: Appropriate  Motor: Normal  Speech/Language:  Clear and Coherent  Affect: Appropriate  Mood: depressed  Thought process: normal  Thought content:   WNL  Sensory/Perceptual disturbances:   WNL  Orientation: oriented to person, place, and time/date  Attention: Good  Concentration: Good  Memory: WNL  Fund of knowledge:  Good  Insight:   Good  Judgment:  Good  Impulse Control: Good   Risk Assessment: Danger to Self:  No Self-injurious Behavior: No Danger to Others: No Duty to Warn:no Physical Aggression / Violence:No  Access to Firearms a concern: No  Gang Involvement:No   Subjective: Pt shares that a lot has been going on lately.  George Singleton started gymnastics again and she fell while doing a cartwheel on the balance beam.  George Singleton was there and did not like the coach's reaction so George Singleton will not be going back to gymnastics.  George Singleton is doing horseback riding as well.  She is in a boot with crutches; as a result she is back in our bed again.  She has also been sleeping in our bed on the weekends as well.  Pt shares that George Singleton is now unemployed and continues to look for a new job; the family therapist has suggested she see her PCP for medication for anxiety due to job loss stress.  George Singleton has met another child diabetic who has a service dog and now she wants one of those too.  George Singleton also wants a pool at out home as well.  Pt shares that George Singleton has been looking more closely at the family finances; she has been in favor of getting rid of pt's subscriptions and is not willing to let go of TV  subscriptions.  George Singleton continues to see her therapist and pt and George Singleton are seeing her therapist as well for family counseling without Brianna.  Pt and George Singleton have been arguing quite a bit lately.  They also had their 12th wedding anniversary this past Sunday; pt did give George Singleton a card and she seemed to appreciate it.  Pt has been listening to podcasts on marriage and intimacy.  Pt continues to take his Wellbutrin  daily and believes that it is being beneficial for him.  Encouraged pt to continue with his self care activities and we will meet in 2 wks for a follow up session.  Interventions: Cognitive Behavioral Therapy  Diagnosis:Adjustment disorder with depressed mood  Plan: Treatment Plan Strengths/Abilities:  Intelligent, Intuitive, Willing to participate in therapy Treatment Preferences:  Outpatient Individual Therapy Statement of Needs:  Patient is to use CBT, mindfulness and coping skills to help manage and/or decrease symptoms associated with their diagnosis. Symptoms:  Depressed/Irritable mood, worry, social withdrawal Problems Addressed:  Depressive thoughts, Sadness, Sleep issues, etc. Long Term Goals:  Pt to reduce overall level, frequency, and intensity of the feelings of depression as evidenced by decreased irritability, negative self talk, and helpless feelings from 6 to 7 days/week to 0 to 1 days/week, per client report, for at least 3 consecutive months.  Progress: 30% Short Term Goals:  Pt to verbally express understanding of the relationship between  feelings of depression and their impact on thinking patterns and behaviors.  Pt to verbalize an understanding of the role that distorted thinking plays in creating fears, excessive worry, and ruminations.  Progress: 30% Target Date:  09/13/2024 Frequency:  Bi-weekly Modality:  Cognitive Behavioral Therapy Interventions by Therapist:  Therapist will use CBT, Mindfulness exercises, Coping skills and Referrals, as needed by  client. Client has verbally approved this treatment plan.  Francis KATHEE Macintosh, [facility]

## 2024-04-20 ENCOUNTER — Other Ambulatory Visit (HOSPITAL_COMMUNITY): Payer: Self-pay

## 2024-04-20 NOTE — Telephone Encounter (Signed)
 Left a detailed message with the approval information below on the pharmacy voicemail at [facility].

## 2024-04-20 NOTE — Telephone Encounter (Signed)
 The appeal for bupropion  xl [id number] has been approved by [employer].    Thank you, Devere Pandy, PharmD Clinical Pharmacist  [facility]  Direct Dial: [phone]

## 2024-05-04 ENCOUNTER — Ambulatory Visit: Admitting: Psychology

## 2024-05-04 DIAGNOSIS — F4321 Adjustment disorder with depressed mood: Secondary | ICD-10-CM | POA: Diagnosis not present

## 2024-05-04 NOTE — Progress Notes (Signed)
 [facility] Counselor/Therapist Progress Note  Patient ID: George Singleton, MRN: [id number],    Date: 05/04/2024  Time Spent:  60 minutes    start time: 1400    end time: 1500  Treatment Type: Individual Therapy  Reported Symptoms: Pt presents for session, via [facility] video, granting consent for the session.  Pt states he is in his office at home with no one else present and that he understands the limits of virtual sessions.  I shared with pt that I am in the office with no one else here either.  Mental Status Exam: Appearance:  Casual     Behavior: Appropriate  Motor: Normal  Speech/Language:  Clear and Coherent  Affect: Appropriate  Mood: depressed  Thought process: normal  Thought content:   WNL  Sensory/Perceptual disturbances:   WNL  Orientation: oriented to person, place, and time/date  Attention: Good  Concentration: Good  Memory: WNL  Fund of knowledge:  Good  Insight:   Good  Judgment:  Good  Impulse Control: Good   Risk Assessment: Danger to Self:  No Self-injurious Behavior: No Danger to Others: No Duty to Warn:no Physical Aggression / Violence:No  Access to Firearms a concern: No  Gang Involvement:No   Subjective: Pt shares that, Since last session, I would say that things are better.  I have had a chance to see some friends that I had not seen in many years and that was good for me.  [name] has started taking some anti-anxiety medication about a week ago and it seems to be helping her calm down a bit.  I am hopeful that it will continue to be beneficial for her.  Pt shares that the [name]'s therapist has characterized [name] and [name]'s relationship as being enmeshed and [name] is taking some time, but is coming around to that idea and pt is thankful that they may be getting traction to that idea.  The therapist has also reminded [name] that the relationship between pt and [name] is important to nurture and needs to be strong so they can both  be the parents that [place] needs.  The therapist is also in favor of [name] being back in her own bed soon and is trying to be supportive of [name] through the process of getting [name] back in her own bed.  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  [name] continues to seek new employment and is having some opportunities but nothing concrete has happened.  [name]'s foot is healing; she is now out of the boot but has her activity limited, which is hard for her.  Pt has been listening to podcasts on marriage and intimacy.  Pt continues to take his Wellbutrin  daily and believes that it is being beneficial for him.  Encouraged pt to continue with his self care activities and we will meet in 2 wks for a follow up session.  Interventions: Cognitive Behavioral Therapy  Diagnosis:Adjustment disorder with depressed mood  Plan: Treatment Plan Strengths/Abilities:  Intelligent, Intuitive, Willing to participate in therapy Treatment Preferences:  Outpatient Individual Therapy Statement of Needs:  Patient is to use CBT, mindfulness and coping skills to help manage and/or decrease symptoms associated with their diagnosis. Symptoms:  Depressed/Irritable mood, worry, social withdrawal Problems Addressed:  Depressive thoughts,  Sadness, Sleep issues, etc. Long Term Goals:  Pt to reduce overall level, frequency, and intensity of the feelings of depression as evidenced by decreased irritability, negative self talk, and helpless feelings from 6 to 7 days/week to 0 to 1 days/week, per client report, for at least 3 consecutive months.  Progress: 30% Short Term Goals:  Pt to verbally express understanding of the relationship between feelings of depression and their impact on thinking patterns and behaviors.  Pt to  verbalize an understanding of the role that distorted thinking plays in creating fears, excessive worry, and ruminations.  Progress: 30% Target Date:  09/13/2024 Frequency:  Bi-weekly Modality:  Cognitive Behavioral Therapy Interventions by Therapist:  Therapist will use CBT, Mindfulness exercises, Coping skills and Referrals, as needed by client. Client has verbally approved this treatment plan.  Francis KATHEE Macintosh, [facility]

## 2024-05-14 ENCOUNTER — Ambulatory Visit: Payer: Self-pay | Admitting: Family Medicine

## 2024-05-14 ENCOUNTER — Encounter: Payer: Self-pay | Admitting: Family Medicine

## 2024-05-14 ENCOUNTER — Encounter: Payer: Self-pay | Admitting: Psychology

## 2024-05-14 ENCOUNTER — Ambulatory Visit (INDEPENDENT_AMBULATORY_CARE_PROVIDER_SITE_OTHER): Admitting: Family Medicine

## 2024-05-14 VITALS — BP 98/60 | HR 58 | Temp 98.0°F | Ht 73.75 in | Wt 203.6 lb

## 2024-05-14 DIAGNOSIS — Z8042 Family history of malignant neoplasm of prostate: Secondary | ICD-10-CM | POA: Diagnosis not present

## 2024-05-14 DIAGNOSIS — Z0000 Encounter for general adult medical examination without abnormal findings: Secondary | ICD-10-CM | POA: Diagnosis not present

## 2024-05-14 DIAGNOSIS — R350 Frequency of micturition: Secondary | ICD-10-CM

## 2024-05-14 LAB — URINALYSIS, ROUTINE W REFLEX MICROSCOPIC
Bilirubin Urine: NEGATIVE
Hgb urine dipstick: NEGATIVE
Ketones, ur: NEGATIVE
Leukocytes,Ua: NEGATIVE
Nitrite: NEGATIVE
Specific Gravity, Urine: <=1.005 — AB (ref 1.000–1.030)
Total Protein, Urine: NEGATIVE
Urine Glucose: NEGATIVE
Urobilinogen, UA: 0.2 (ref 0.0–1.0)
pH: 7 (ref 5.0–8.0)

## 2024-05-14 LAB — CBC WITH DIFFERENTIAL/PLATELET
Basophils Absolute: 0 K/uL (ref 0.0–0.1)
Basophils Relative: 0.5 % (ref 0.0–3.0)
Eosinophils Absolute: 0.2 K/uL (ref 0.0–0.7)
Eosinophils Relative: 3 % (ref 0.0–5.0)
HCT: 44.1 % (ref 39.0–52.0)
Hemoglobin: 14.6 g/dL (ref 13.0–17.0)
Lymphocytes Relative: 30.6 % (ref 12.0–46.0)
Lymphs Abs: 2.2 K/uL (ref 0.7–4.0)
MCHC: 33.2 g/dL (ref 30.0–36.0)
MCV: 91.1 fl (ref 78.0–100.0)
Monocytes Absolute: 0.5 K/uL (ref 0.1–1.0)
Monocytes Relative: 7.6 % (ref 3.0–12.0)
Neutro Abs: 4.1 K/uL (ref 1.4–7.7)
Neutrophils Relative %: 58.3 % (ref 43.0–77.0)
Platelets: 215 K/uL (ref 150.0–400.0)
RBC: 4.84 Mil/uL (ref 4.22–5.81)
RDW: 14.1 % (ref 11.5–15.5)
WBC: 7 K/uL (ref 4.0–10.5)

## 2024-05-14 LAB — COMPREHENSIVE METABOLIC PANEL WITH GFR
ALT: 24 U/L (ref 0–53)
AST: 22 U/L (ref 0–37)
Albumin: 4.6 g/dL (ref 3.5–5.2)
Alkaline Phosphatase: 52 U/L (ref 39–117)
BUN: 19 mg/dL (ref 6–23)
CO2: 29 meq/L (ref 19–32)
Calcium: 9.4 mg/dL (ref 8.4–10.5)
Chloride: 105 meq/L (ref 96–112)
Creatinine, Ser: 1.08 mg/dL (ref 0.40–1.50)
GFR: 82.81 mL/min (ref >60.00)
Glucose, Bld: 97 mg/dL (ref 70–99)
Potassium: 4.4 meq/L (ref 3.5–5.1)
Sodium: 140 meq/L (ref 135–145)
Total Bilirubin: 0.3 mg/dL (ref 0.2–1.2)
Total Protein: 6.8 g/dL (ref 6.0–8.3)

## 2024-05-14 LAB — PSA: PSA: 0.72 ng/mL (ref 0.10–4.00)

## 2024-05-14 NOTE — Patient Instructions (Signed)
 Health Maintenance, Male  Adopting a healthy lifestyle and getting preventive care are important in promoting health and wellness. Ask your health care provider about:  The right schedule for you to have regular tests and exams.  Things you can do on your own to prevent diseases and keep yourself healthy.  What should I know about diet, weight, and exercise?  Eat a healthy diet    Eat a diet that includes plenty of vegetables, fruits, low-fat dairy products, and lean protein.  Do not eat a lot of foods that are high in solid fats, added sugars, or sodium.  Maintain a healthy weight  Body mass index (BMI) is a measurement that can be used to identify possible weight problems. It estimates body fat based on height and weight. Your health care provider can help determine your BMI and help you achieve or maintain a healthy weight.  Get regular exercise  Get regular exercise. This is one of the most important things you can do for your health. Most adults should:  Exercise for at least 150 minutes each week. The exercise should increase your heart rate and make you sweat (moderate-intensity exercise).  Do strengthening exercises at least twice a week. This is in addition to the moderate-intensity exercise.  Spend less time sitting. Even light physical activity can be beneficial.  Watch cholesterol and blood lipids  Have your blood tested for lipids and cholesterol at 45 years of age, then have this test every 5 years.  You may need to have your cholesterol levels checked more often if:  Your lipid or cholesterol levels are high.  You are older than 45 years of age.  You are at high risk for heart disease.  What should I know about cancer screening?  Many types of cancers can be detected early and may often be prevented. Depending on your health history and family history, you may need to have cancer screening at various ages. This may include screening for:  Colorectal cancer.  Prostate cancer.  Skin cancer.  Lung  cancer.  What should I know about heart disease, diabetes, and high blood pressure?  Blood pressure and heart disease  High blood pressure causes heart disease and increases the risk of stroke. This is more likely to develop in people who have high blood pressure readings or are overweight.  Talk with your health care provider about your target blood pressure readings.  Have your blood pressure checked:  Every 3-5 years if you are 24-52 years of age.  Every year if you are 3 years old or older.  If you are between the ages of 60 and 72 and are a current or former smoker, ask your health care provider if you should have a one-time screening for abdominal aortic aneurysm (AAA).  Diabetes  Have regular diabetes screenings. This checks your fasting blood sugar level. Have the screening done:  Once every three years after age 66 if you are at a normal weight and have a low risk for diabetes.  More often and at a younger age if you are overweight or have a high risk for diabetes.  What should I know about preventing infection?  Hepatitis B  If you have a higher risk for hepatitis B, you should be screened for this virus. Talk with your health care provider to find out if you are at risk for hepatitis B infection.  Hepatitis C  Blood testing is recommended for:  Everyone born from [birth year] through 1967.  Anyone  with known risk factors for hepatitis C.  Sexually transmitted infections (STIs)  You should be screened each year for STIs, including gonorrhea and chlamydia, if:  You are sexually active and are younger than 45 years of age.  You are older than 45 years of age and your health care provider tells you that you are at risk for this type of infection.  Your sexual activity has changed since you were last screened, and you are at increased risk for chlamydia or gonorrhea. Ask your health care provider if you are at risk.  Ask your health care provider about whether you are at high risk for HIV. Your health care provider  may recommend a prescription medicine to help prevent HIV infection. If you choose to take medicine to prevent HIV, you should first get tested for HIV. You should then be tested every 3 months for as long as you are taking the medicine.  Follow these instructions at home:  Alcohol use  Do not drink alcohol if your health care provider tells you not to drink.  If you drink alcohol:  Limit how much you have to 0-2 drinks a day.  Know how much alcohol is in your drink. In the [place] one drink equals one 12 oz bottle of beer (355 mL), one 5 oz glass of wine (148 mL), or one 1 oz glass of hard liquor (44 mL).  Lifestyle  Do not use any products that contain nicotine or tobacco. These products include cigarettes, chewing tobacco, and vaping devices, such as e-cigarettes. If you need help quitting, ask your health care provider.  Do not use street drugs.  Do not share needles.  Ask your health care provider for help if you need support or information about quitting drugs.  General instructions  Schedule regular health, dental, and eye exams.  Stay current with your vaccines.  Tell your health care provider if:  You often feel depressed.  You have ever been abused or do not feel safe at home.  Summary  Adopting a healthy lifestyle and getting preventive care are important in promoting health and wellness.  Follow your health care provider's instructions about healthy diet, exercising, and getting tested or screened for diseases.  Follow your health care provider's instructions on monitoring your cholesterol and blood pressure.  This information is not intended to replace advice given to you by your health care provider. Make sure you discuss any questions you have with your health care provider.  Document Revised: 12/08/2020 Document Reviewed: 12/08/2020  Elsevier Patient Education  2024 [employer].

## 2024-05-14 NOTE — Progress Notes (Signed)
 Complete physical exam  Patient: George Singleton   DOB: [birth year]   43 y.o. Male  MRN: [id number]  Subjective:    Chief Complaint  Patient presents with   Annual Exam    George Singleton is a 43 y.o. male who presents today for a complete physical exam. He reports consuming a general diet. Home exercise routine includes running several days per week. He generally feels fairly well. He reports sleeping fairly well. He does not have additional problems to discuss today.    Most recent fall risk assessment:    05/12/2023    1:25 PM  Fall Risk   Falls in the past year? 0  Number falls in past yr: 0  Injury with Fall? 0  Risk for fall due to : No Fall Risks  Follow up Falls evaluation completed     Most recent depression screenings:    05/14/2024    9:00 AM 02/15/2024    8:29 AM  PHQ 2/9 Scores  PHQ - 2 Score 1   PHQ- 9 Score 5   Exception Documentation  Patient refusal    Vision:Within last year and Dental: No current dental problems and Receives regular dental care  Patient Active Problem List   Diagnosis Date Noted   Myalgia due to statin 02/13/2024   Dyslipidemia, goal LDL below 70 02/13/2024   Current moderate episode of major depressive disorder without prior episode (HCC) 08/23/2023   Chronic rhinitis 06/18/2023   Carbuncle of nose 06/18/2023   Bradycardia 07/23/2022   Chronic pain syndrome 03/19/2021   Acute pain of right knee 05/22/2020   Palpitations 03/23/2020   Finger pain, right 11/20/2019   Enthesopathy of left knee region 10/09/2019   Chronic pain of left knee 07/30/2019   Acute pain of left knee 06/25/2019   Leg length discrepancy 03/19/2019   Facet arthropathy, lumbar 02/08/2019   Lumbosacral spondylosis without myelopathy 01/26/2019   Myalgia 12/06/2018   Seasonal allergies 11/24/2018   Sacroiliac pain 10/06/2018   Right hip pain 08/25/2018   Lumbosacral radiculitis 07/19/2018   Hemorrhoids 08/30/2016   S/P ablation of atrial fibrillation  02/20/2015   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   buPROPion  HCl ER, XL, 450 MG TB24 Take 1 tablet (450 mg total) by mouth daily.   Calcium  Carbonate-Vit D-Min (CALTRATE 600+D PLUS MINERALS) 600-800 MG-UNIT TABS Take 1 tablet by mouth 2 (two) times a day.   diclofenac  Sodium (VOLTAREN ) 1 % GEL APPLY 2 GRAMS TO AFFECTED AREA 4 TIMES A DAY   Evolocumab  (REPATHA  SURECLICK) 140 MG/ML SOAJ Inject 140 mg into the skin every 14 (fourteen) days.   fluticasone  (FLONASE ) 50 MCG/ACT nasal spray SPRAY 2 SPRAYS INTO EACH NOSTRIL EVERY DAY   ibuprofen  (ADVIL ,MOTRIN ) 600 MG tablet Take 1 tablet (600 mg total) by mouth daily as needed.   ketotifen (ZADITOR) 0.025 % ophthalmic solution Apply to eye.   levocetirizine (XYZAL) 5 MG tablet SMARTSIG:1 Tablet(s) By Mouth Every Evening   lidocaine  (LIDODERM ) 5 % Place 1 patch onto the skin as needed. Remove &  Discard patch within 12 hours or as directed by MD   magnesium oxide (MAG-OX) 400 MG tablet Take 400 mg by mouth daily.   Multiple Vitamins-Minerals (MULTIVITAMIN ADULT PO) Take 1 tablet by mouth daily.    Multiple Vitamins-Minerals (ZINC PO) Take 1 tablet by mouth daily.   Omega-3 Fatty Acids (FISH OIL PO) Take 1 capsule by mouth daily.    omeprazole  (PRILOSEC) 20 MG capsule TAKE ONE CAPSULE BY MOUTH DAILY AS NEEDED (TAKE ON AN EMPTY STOMACH 30 MINUTES PRIOR TO A MEAL)   sodium chloride  (OCEAN) 0.65 % SOLN nasal spray Place 1 spray into both nostrils as needed for congestion.   tretinoin (RETIN-A) 0.05 % cream APPLY TO AFFECTED AREA EVERY  DAY AT BEDTIME   [DISCONTINUED] psyllium (METAMUCIL) 58.6 % powder Take 1 packet by mouth daily.   No facility-administered medications prior to visit.    Review of Systems  HENT:  Negative for hearing loss.   Eyes:  Negative for blurred vision.  Respiratory:  Negative for shortness of breath.   Cardiovascular:  Negative for chest pain.  Gastrointestinal: Negative.   Genitourinary: Negative.   Musculoskeletal:  Negative for back pain.  Neurological:  Negative for headaches.  Psychiatric/Behavioral:  Negative for depression.   All other systems reviewed and are negative.      Objective:     BP 98/60   Pulse (!) 58   Temp 98 F (36.7 C) (Oral)   Ht 6' 1.75 (1.873 m)   Wt 203 lb 9.6 oz (92.4 kg)   SpO2 99%   BMI 26.32 kg/m    Physical Exam Vitals reviewed.  Constitutional:      Appearance: Normal appearance. He is normal weight.  HENT:     Right Ear: Tympanic membrane normal.     Left Ear: Tympanic membrane normal.     Mouth/Throat:     Mouth: Mucous membranes are moist.     Pharynx: No posterior oropharyngeal erythema.  Eyes:     Conjunctiva/sclera: Conjunctivae normal.  Neck:     Thyroid: No thyromegaly.  Cardiovascular:     Rate and Rhythm: Normal rate and regular rhythm.     Pulses: Normal pulses.     Heart sounds: No murmur heard. Abdominal:     General: Abdomen is flat. Bowel sounds are normal.  Lymphadenopathy:     Cervical:     Right cervical: No superficial cervical adenopathy.    Left cervical: No superficial cervical adenopathy.  Neurological:     Mental Status: He is alert and oriented to person, place, and time. Mental status is at baseline.  Psychiatric:        Mood and Affect: Mood normal.        Behavior: Behavior normal.      No results found for any visits on 05/14/24.     Assessment & Plan:    Routine Health Maintenance and Physical Exam  Immunization History  Administered Date(s) Administered   Anthrax 06/08/2002,  06/26/2002, 07/17/2002, 12/15/2002   Fluzone Influenza virus vaccine,trivalent (IIV3), split virus 04/21/2018   Hepatitis A, Adult 09/14/2000, 06/09/2001   Hepatitis B, ADULT 06/10/2004, 10/01/2005, 03/18/2006   Influenza Inj Mdck Quad Pf 04/04/2021   Influenza Inj Mdck Quad With Preservative 04/08/2023   Influenza Nasal 06/02/2005   Influenza, Mdck, Trivalent,PF 6+ MOS(egg free) 04/08/2023, 04/14/2024   Influenza, Quadrivalent, Recombinant, Inj, Pf 05/01/2022   Influenza,inj,Quad PF,6+ Mos 05/11/2015, 04/10/2017, 03/22/2018, 05/10/2019   Influenza-Unspecified 06/09/2001, 06/26/2002, 06/26/2003, 06/10/2004, 06/10/2006, 05/18/2008, 05/10/2016, 04/10/2017, 03/22/2018,  05/03/2019, 04/02/2020, 05/02/2020, 03/02/2021   MMR 02/16/2001, 10/19/2023   Meningococcal polysaccharide vaccine (MPSV4) 02/16/2001   Moderna Covid-19 Fall Seasonal Vaccine 82yrs & older 04/08/2023   Moderna Sars-Covid-2 Vaccination 05/30/2020   PFIZER(Purple Top)SARS-COV-2 Vaccination 09/08/2019, 09/29/2019   Pfizer Covid-19 Vaccine Bivalent Booster 38yrs & up 04/11/2021   Pfizer Covid-19 Vaccine Bivalent Booster 5y-11y 04/11/2021   Pfizer(Comirnaty)Fall Seasonal Vaccine 12 years and older 05/01/2022, 04/16/2024   Polio, Unspecified 02/16/2001   Smallpox 08/08/2002   Td 02/16/2001   Tdap 07/02/2014, 06/08/2018   Typhoid Live 08/30/2001   Unspecified SARS-COV-2 Vaccination 04/08/2023    Health Maintenance  Topic Date Due   HPV VACCINES (1 - Risk 3-dose SCDM series) Never done   COVID-19 Vaccine (8 - Mixed Product risk 2025-26 season) 10/14/2024   DTaP/Tdap/Td (4 - Td or Tdap) 06/08/2028   Colonoscopy  01/18/2031   Influenza Vaccine  Completed   Hepatitis B Vaccines 19-59 Average Risk  Completed   Hepatitis C Screening  Completed   HIV Screening  Addressed   Pneumococcal Vaccine  Aged Out   Meningococcal B Vaccine  Aged Out    Discussed health benefits of physical activity, and encouraged him to engage in regular  exercise appropriate for his age and condition.  Family history of prostate cancer in father -     PSA; Future  Routine general medical examination at a health care facility -     CBC with Differential/Platelet; Future -     Comprehensive metabolic panel with GFR; Future  Urinary frequency -     Urinalysis, Routine w reflex microscopic; Future  General physical exam findings are normal today. I reviewed the patient's preventative testing, immunizations, and lifestyle habits. I made appropriate recommendations and placed orders for the appropriate tests and/or vaccinations. I counseled the patient on the CDC's recommendations for healthy exercise and diet. I counseled the patient on healthy sleep habits and stress management. Handouts to reinforce the counseling were given at the conclusion of the visit.    Return in 6 months (on 11/12/2024).     Heron CHRISTELLA Sharper, MD

## 2024-05-18 ENCOUNTER — Ambulatory Visit: Admitting: Psychology

## 2024-05-22 ENCOUNTER — Ambulatory Visit: Admitting: Psychology

## 2024-05-22 DIAGNOSIS — F4321 Adjustment disorder with depressed mood: Secondary | ICD-10-CM | POA: Diagnosis not present

## 2024-05-22 NOTE — Progress Notes (Signed)
 [facility] Counselor/Therapist Progress Note  Patient ID: George Singleton, MRN: [id number],    Date: 05/22/2024  Time Spent:  60 minutes    start time: 1400    end time: 1500  Treatment Type: Individual Therapy  Reported Symptoms: Pt presents for session, in person, in the office, granting consent for the session.   Mental Status Exam: Appearance:  Casual     Behavior: Appropriate  Motor: Normal  Speech/Language:  Clear and Coherent  Affect: Appropriate  Mood: depressed  Thought process: normal  Thought content:   WNL  Sensory/Perceptual disturbances:   WNL  Orientation: oriented to person, place, and time/date  Attention: Good  Concentration: Good  Memory: WNL  Fund of knowledge:  Good  Insight:   Good  Judgment:  Good  Impulse Control: Good   Risk Assessment: Danger to Self:  No Self-injurious Behavior: No Danger to Others: No Duty to Warn:no Physical Aggression / Violence:No  Access to Firearms a concern: No  Gang Involvement:No   Subjective: Pt shares that, I have been pretty good since our last session.  [name] has gotten a new contract position and she feels good about that.  She will be starting on Monday and it may lead to permanent employment.  Pt believes the job they have in [place] will be good for them and will likely last 5-10 yrs.  He will have to be going to [place] about twice per month.  Pt shares, It has been a long time since I have felt relaxed in my relationship; [name] has a hard time relaxing; there seems to always be something going on with [name] as well for [place].  I think we have been a bit better off lately and that has been good.  Pt mentions that [name] has recently started an antidepressant and feels like it has been helpful for her.  They are having to pause the family therapy because of Amanda's new job; they will re-start after a few weeks; [name] will be continuing with her therapist in the meantime.  [name] is  still  sleeping in their bed, especially on the weekends.  Pt continues to have sexual desires for [name] and he notes that she is 9 yrs older than pt (43. yo).  Pt continues to take his Wellbutrin  daily and believes that it is being beneficial for him.  Encouraged pt to continue with his self care activities and we will meet in 2 wks for a follow up session.  Interventions: Cognitive Behavioral Therapy  Diagnosis:Adjustment disorder with depressed mood  Plan: Treatment Plan Strengths/Abilities:  Intelligent, Intuitive, Willing to participate in therapy Treatment Preferences:  Outpatient Individual Therapy Statement of Needs:  Patient is to use CBT, mindfulness and coping skills to help manage and/or decrease symptoms associated with their diagnosis. Symptoms:  Depressed/Irritable mood, worry, social withdrawal Problems Addressed:  Depressive thoughts, Sadness, Sleep issues, etc. Long Term Goals:  Pt to reduce overall level, frequency, and intensity of the feelings of depression as evidenced by decreased irritability, negative self talk, and helpless feelings from 6 to 7 days/week to 0 to 1 days/week, per client report, for at least 3 consecutive months.  Progress: 30% Short Term Goals:  Pt to verbally express understanding of the relationship between feelings of depression and their impact on thinking patterns and behaviors.  Pt to verbalize an understanding of the role that distorted thinking plays in creating fears, excessive worry, and ruminations.  Progress: 30% Target Date:  09/13/2024 Frequency:  Bi-weekly  Modality:  Cognitive Behavioral Therapy Interventions by Therapist:  Therapist will use CBT, Mindfulness exercises, Coping skills and Referrals, as needed by client. Client has verbally approved this treatment plan.  Francis KATHEE Macintosh, [facility]

## 2024-06-07 ENCOUNTER — Encounter: Payer: Self-pay | Admitting: Family Medicine

## 2024-06-08 ENCOUNTER — Ambulatory Visit: Admitting: Psychology

## 2024-06-08 DIAGNOSIS — F4321 Adjustment disorder with depressed mood: Secondary | ICD-10-CM | POA: Diagnosis not present

## 2024-06-08 NOTE — Progress Notes (Signed)
 [facility] Counselor/Therapist Progress Note  Patient ID: George Singleton, MRN: [id number],    Date: 06/08/2024  Time Spent:  60 minutes    start time: 1500    end time: 1600  Treatment Type: Individual Therapy  Reported Symptoms: Pt presents for session, via Caregility video, granting consent for the session.  Pt shares he is in his office with no one else present and understands the limits of virtual sessions.  I shared with pt that I am in my office with no one else here.    Mental Status Exam: Appearance:  Casual     Behavior: Appropriate  Motor: Normal  Speech/Language:  Clear and Coherent  Affect: Appropriate  Mood: depressed  Thought process: normal  Thought content:   WNL  Sensory/Perceptual disturbances:   WNL  Orientation: oriented to person, place, and time/date  Attention: Good  Concentration: Good  Memory: WNL  Fund of knowledge:  Good  Insight:   Good  Judgment:  Good  Impulse Control: Good   Risk Assessment: Danger to Self:  No Self-injurious Behavior: No Danger to Others: No Duty to Warn:no Physical Aggression / Violence:No  Access to Firearms a concern: No  Gang Involvement:No   Subjective: Pt shares that, I have been pretty good since our last session.  We had a fun Halloween as a family and that was good.  We went to the Fall Festival at [employer] school and that was fun.  We trick or treated with new neighbors and that was fun.  Pt shares that George Singleton seems to be settling into her new job well.  She is still happy with the job choice and is getting positive feedback from her boss.  Pt shares that George Singleton is doing well; she seems to be growing up right in front of us .  Pt has been taking her to appts because George Singleton is still in training for her new job; he took her this past week to her therapy appt.  The therapist asked to speak with pt and the therapist want to know his goals for George Singleton at this time.  He mentioned getting George Singleton getting  back into her own bed.  George Singleton has been sleeping in her own bed more frequently.  Pt shares that he and George Singleton have been getting along better of late and he attributes that to both of them being on their antidepressant medications.  He shares that he has initiated intimate interactions and they have been successful at least twice.  Pt talks about how they handle the holidays; George Singleton does not get along well with pt's parents so holidays can be difficult.  Pt shares that they are going to [place] the weekend after Thanksgiving.  They are going to [place] after Christmas; they are enjoying more experiential gifts at this stage of their life.  Pt continues to take his Wellbutrin  daily and believes that it is being beneficial for him.  Encouraged pt to continue with his self care activities and we will meet in 2 wks for a follow up session.  Interventions: Cognitive Behavioral Therapy  Diagnosis:Adjustment disorder with depressed mood  Plan: Treatment Plan Strengths/Abilities:  Intelligent, Intuitive, Willing to participate in therapy Treatment Preferences:  Outpatient Individual Therapy Statement of Needs:  Patient is to use CBT, mindfulness and coping skills to help manage and/or decrease symptoms associated with their diagnosis. Symptoms:  Depressed/Irritable mood, worry, social withdrawal Problems Addressed:  Depressive thoughts, Sadness, Sleep issues, etc. Long Term Goals:  Pt to reduce overall  level, frequency, and intensity of the feelings of depression as evidenced by decreased irritability, negative self talk, and helpless feelings from 6 to 7 days/week to 0 to 1 days/week, per client report, for at least 3 consecutive months.  Progress: 30% Short Term Goals:  Pt to verbally express understanding of the relationship between feelings of depression and their impact on thinking patterns and behaviors.  Pt to verbalize an understanding of the role that distorted thinking plays in creating  fears, excessive worry, and ruminations.  Progress: 30% Target Date:  09/13/2024 Frequency:  Bi-weekly Modality:  Cognitive Behavioral Therapy Interventions by Therapist:  Therapist will use CBT, Mindfulness exercises, Coping skills and Referrals, as needed by client. Client has verbally approved this treatment plan.  Francis KATHEE Macintosh, [facility]

## 2024-06-11 ENCOUNTER — Ambulatory Visit: Payer: Self-pay | Admitting: *Deleted

## 2024-06-11 NOTE — Telephone Encounter (Signed)
 FYI Only or Action Required?: FYI only for provider: appointment scheduled on 11/13.  Patient was last seen in primary care on 05/14/2024 by Ozell Heron HERO, MD.  Called Nurse Triage reporting Erectile Dysfunction.  Symptoms began several weeks ago.  Interventions attempted: Nothing.  Symptoms are: unchanged.  Triage Disposition: See PCP When Office is Open (Within 3 Days)  Patient/caregiver understands and will follow disposition?:  Copied from CRM [id number]. Topic: Clinical - Red Word Triage >> Jun 11, 2024  8:08 AM [name] ORN wrote: Red Word that prompted transfer to Nurse Triage: Worsening Symptoms >> Jun 11, 2024  8:10 AM [name] ORN wrote: Patient states he's experiencing some worsening symptoms that are related to his sexual health, patient wouldn't give specific details / symptoms.  Reason for Disposition  All other penis - scrotum symptoms  (Exception: Painless rash < 24 hours duration.)  Answer Assessment - Initial Assessment Questions 1. SYMPTOM: What's the main symptom you're concerned about? (e.g., blood in semen, discharge or pus from penis, itching, pain, rash, swelling)     ED  3. ONSET: When did ED  start?     Past couple weeks it is worse 4. PAIN: Is there any pain? If Yes, ask: How bad is it?  (Scale 1-10; or mild, moderate, severe)     no 5. URINE: Any difficulty passing urine? If Yes, ask: When was the last time?     no 6. CAUSE: What do you think is causing the symptoms?     Hormone levels 7. OTHER SYMPTOMS: Do you have any other symptoms? (e.g., blood in urine, abdomen pain, fever)     Occasional discharge, muscle soreness  Protocols used: Penis and Scrotum Symptoms-A-AH

## 2024-06-14 ENCOUNTER — Ambulatory Visit: Admitting: Family Medicine

## 2024-06-14 ENCOUNTER — Encounter: Payer: Self-pay | Admitting: Family Medicine

## 2024-06-14 VITALS — BP 90/60 | HR 65 | Temp 98.2°F | Ht 73.75 in | Wt 201.8 lb

## 2024-06-14 DIAGNOSIS — N529 Male erectile dysfunction, unspecified: Secondary | ICD-10-CM

## 2024-06-14 DIAGNOSIS — R369 Urethral discharge, unspecified: Secondary | ICD-10-CM

## 2024-06-14 MED ORDER — TADALAFIL 5 MG PO TABS: 2.5000 mg | ORAL_TABLET | Freq: Every day | ORAL | 2 refills | Status: AC

## 2024-06-14 NOTE — Progress Notes (Signed)
 Established Patient Office Visit  Subjective   Patient ID: George Singleton, male    DOB: 10-10-1980  Age: 43 y.o. MRN: [id number]  Chief Complaint  Patient presents with   Erectile Dysfunction    Erectile Dysfunction   Discussed the use of AI scribe software for clinical note transcription with the patient, who gave verbal consent to proceed.  History of Present Illness   George Singleton is a 43 year old male who presents with erectile dysfunction.  He experiences difficulty achieving and maintaining an erection, with erections not reaching full rigidity and sexual activity ending quickly. These symptoms have been present for the last two weeks.  A testosterone  panel in 2021 showed a level of 443, on the lower end of normal. He experiences low energy levels.  He has a persistent discharge. A previous urine test was normal. There is no burning during urination, pelvic pain, fevers, or chills. He is monogamous.  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 He occasionally experiences infrequent but notable pain in the testicular area.  His current medications include bupropion , ibuprofen , Zyrtec, Prilosec, and rare use of baclofen . He recently started Repatha  but was experiencing symptoms prior to its initiation.       Current Outpatient Medications  Medication Instructions   Azelastine HCl 137 MCG/SPRAY SOLN 1 - 2 puffs daily   baclofen  (LIORESAL ) 20 mg, Oral, Daily PRN   buPROPion  HCl ER (XL) 450 mg, Oral, Daily   Calcium  Carbonate-Vit D-Min (CALTRATE 600+D PLUS MINERALS) 600-800 MG-UNIT TABS 1 tablet, 2 times daily   diclofenac  Sodium (VOLTAREN ) 1 % GEL APPLY 2 GRAMS TO AFFECTED AREA 4 TIMES A DAY   fluticasone  (FLONASE ) 50 MCG/ACT nasal spray SPRAY 2 SPRAYS INTO EACH NOSTRIL EVERY DAY   ibuprofen  (ADVIL ) 600 mg, Oral, Daily PRN   ketotifen (ZADITOR) 0.025 % ophthalmic solution Apply to  eye.   levocetirizine (XYZAL) 5 MG tablet SMARTSIG:1 Tablet(s) By Mouth Every Evening   lidocaine  (LIDODERM ) 5 % 1 patch, As needed   magnesium oxide (MAG-OX) 400 mg, Daily   Multiple Vitamins-Minerals (MULTIVITAMIN ADULT PO) 1 tablet, Daily   Multiple Vitamins-Minerals (ZINC PO) 1 tablet, Daily   Omega-3 Fatty Acids (FISH OIL PO) 1 capsule, Daily   omeprazole  (PRILOSEC) 20 MG capsule TAKE ONE CAPSULE BY MOUTH DAILY AS NEEDED (TAKE ON AN EMPTY STOMACH 30 MINUTES PRIOR TO A MEAL)   Repatha  SureClick 140 mg, Subcutaneous, Every 14 days   sodium chloride  (OCEAN) 0.65 % SOLN nasal spray 1 spray, As needed   tadalafil (CIALIS) 2.5-5 mg, Oral, Daily   tretinoin (RETIN-A) 0.05 % cream APPLY TO AFFECTED AREA EVERY DAY AT BEDTIME    Patient Active Problem List   Diagnosis Date Noted   Myalgia due to statin 02/13/2024   Dyslipidemia, goal LDL below 70 02/13/2024   Current moderate episode of major depressive disorder without prior episode (HCC) 08/23/2023   Chronic rhinitis 06/18/2023   Carbuncle of nose 06/18/2023   Bradycardia 07/23/2022   Chronic pain syndrome 03/19/2021   Acute pain of right knee 05/22/2020   Palpitations 03/23/2020   Finger pain, right 11/20/2019   Enthesopathy of left knee region 10/09/2019   Chronic pain of left knee 07/30/2019   Acute pain of left knee 06/25/2019   Leg length discrepancy 03/19/2019   Facet arthropathy, lumbar 02/08/2019   Lumbosacral spondylosis without myelopathy 01/26/2019   Myalgia 12/06/2018  Seasonal allergies 11/24/2018   Sacroiliac pain 10/06/2018   Right hip pain 08/25/2018   Lumbosacral radiculitis 07/19/2018   Hemorrhoids 08/30/2016   S/P ablation of atrial fibrillation 02/20/2015   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     Objective:      BP 90/60   Pulse 65   Temp 98.2 F (36.8 C) (Oral)   Ht 6' 1.75 (1.873 m)   Wt 201 lb 12.8 oz (91.5 kg)   SpO2 98%   BMI 26.09 kg/m    Physical Exam Vitals reviewed.  Constitutional:      Appearance: Normal appearance. He is normal weight.  Eyes:     Conjunctiva/sclera: Conjunctivae normal.  Pulmonary:     Effort: Pulmonary effort is normal.  Neurological:     Mental Status: He is alert and oriented to person, place, and time. Mental status is at baseline.  Psychiatric:        Mood and Affect: Mood normal.        Behavior: Behavior normal.      No results found for any visits on 06/14/24.    The 10-year ASCVD risk score (Arnett DK, et al., 2019) is: 0.8%    Assessment & Plan:  Erectile dysfunction, unspecified erectile dysfunction type -     Testosterone ; Standing -     Tadalafil; Take 0.5-1 tablets (2.5-5 mg total) by mouth daily.  Dispense: 30 tablet; Refill: 2 -     Ambulatory referral to Urology  Urethral discharge in male -     Ambulatory referral to Urology   Assessment and Plan    Erectile dysfunction Difficulty achieving and maintaining an erection, with symptoms persisting for at least two weeks. Previous testosterone  level was 443 ng/dL, normal but on the lower end. Differential includes medication side effects, particularly from bupropion , though less likely due to its intended use to avoid such issues. Other medications are unlikely contributors. Phosphodiesterase inhibitors are the treatment of choice unless extremely low testosterone  is confirmed. - Ordered three monthly testosterone  levels, preferably between 7 AM and 10 AM. - Prescribed Cialis, starting with half a tablet once daily, with the option to increase to a full tablet if needed. - Advised checking GoodRx.com for potential coupons if insurance does not cover Cialis.  Urethral discharge Intermittent urethral discharge with no associated symptoms such as dysuria, pelvic pain, fever, or  chills. Previous urine test was normal. Low risk for sexually transmitted infections due to monogamous relationship, but asymptomatic infections are possible. History of spermatocele and testicular mass, previously evaluated as benign. - Referred to urologist for further evaluation of urethral discharge and testicular history.        No follow-ups on file.    Heron CHRISTELLA Sharper, MD

## 2024-06-22 ENCOUNTER — Ambulatory Visit: Admitting: Psychology

## 2024-06-22 DIAGNOSIS — F4321 Adjustment disorder with depressed mood: Secondary | ICD-10-CM | POA: Diagnosis not present

## 2024-06-22 NOTE — Progress Notes (Signed)
 [facility] Counselor/Therapist Progress Note  Patient ID: Quantrell Splitt, MRN: [id number],    Date: 06/22/2024  Time Spent:  60 minutes    start time: 1102    end time: 1200  Treatment Type: Individual Therapy  Reported Symptoms: Pt presents for session, via Caregility video, granting consent for the session.  Pt shares he is in his office with no one else present and understands the limits of virtual sessions.  I shared with pt that I am in my office with no one else here.    Mental Status Exam: Appearance:  Casual     Behavior: Appropriate  Motor: Normal  Speech/Language:  Clear and Coherent  Affect: Appropriate  Mood: depressed  Thought process: normal  Thought content:   WNL  Sensory/Perceptual disturbances:   WNL  Orientation: oriented to person, place, and time/date  Attention: Good  Concentration: Good  Memory: WNL  Fund of knowledge:  Good  Insight:   Good  Judgment:  Good  Impulse Control: Good   Risk Assessment: Danger to Self:  No Self-injurious Behavior: No Danger to Others: No Duty to Warn:no Physical Aggression / Violence:No  Access to Firearms a concern: No  Gang Involvement:No   Subjective: Pt shares that, I have been pretty good since our last session.  I am busy with work and [name] is still training with her new job so I am having to do more of the running around with [name].  I take her to the doctor's appts and yesterday took her to her therapy appt.  Pt also describes that he went to [facility] for a day trip for a mtg this week.  Pt shares that work is describing a challenge at work with a poorly performing division; they are having to think about what to do with the new year coming.  The company has plenty of work that they are doing right now and he feels good about that.  Pt shares that [name] is sleeping more often in her own room.  He was able to meet with the therapist and [name] yesterday and pt found that helpful.  Pt is  pleased that they are working toward getting [name] back to her bed full time and is please with the progress that is being made.  Pt shares that he and [name] have been getting along better of late and he attributes that to both of them being on their antidepressant medications.  Pt has had some performance issues during intimacy with [name] and she is being supportive of him in this.  Pt shares that they are going to [place] the weekend after Thanksgiving.  They are going to [place] after Christmas; they are enjoying more experiential gifts at this stage of their life.  Pt continues to take his Wellbutrin  daily and believes that it is being beneficial for him.  Encouraged pt to continue with his self care activities and we will meet in 4 wks for a follow up session.  Interventions: Cognitive Behavioral Therapy  Diagnosis:Adjustment disorder with depressed mood  Plan: Treatment Plan Strengths/Abilities:  Intelligent, Intuitive, Willing to participate in therapy Treatment Preferences:  Outpatient Individual Therapy Statement of Needs:  Patient is to use CBT, mindfulness and coping skills to help manage and/or decrease symptoms associated with their diagnosis. Symptoms:  Depressed/Irritable mood, worry, social withdrawal Problems Addressed:  Depressive thoughts, Sadness, Sleep issues, etc. Long Term Goals:  Pt to reduce overall level, frequency, and intensity of the feelings of depression as evidenced by  decreased irritability, negative self talk, and helpless feelings from 6 to 7 days/week to 0 to 1 days/week, per client report, for at least 3 consecutive months.  Progress: 30% Short Term Goals:  Pt to verbally express understanding of the relationship between feelings of depression and their impact on thinking patterns and behaviors.  Pt to verbalize an understanding of the role that distorted thinking plays in creating fears, excessive worry, and ruminations.  Progress: 30% Target Date:   09/13/2024 Frequency:  Bi-weekly Modality:  Cognitive Behavioral Therapy Interventions by Therapist:  Therapist will use CBT, Mindfulness exercises, Coping skills and Referrals, as needed by client. Client has verbally approved this treatment plan.  Francis KATHEE Macintosh, [facility]

## 2024-06-27 ENCOUNTER — Other Ambulatory Visit (INDEPENDENT_AMBULATORY_CARE_PROVIDER_SITE_OTHER)

## 2024-06-27 DIAGNOSIS — N529 Male erectile dysfunction, unspecified: Secondary | ICD-10-CM | POA: Diagnosis not present

## 2024-06-27 LAB — TESTOSTERONE: Testosterone: 249.86 ng/dL — ABNORMAL LOW (ref 300.00–890.00)

## 2024-07-03 ENCOUNTER — Other Ambulatory Visit: Payer: Self-pay | Admitting: Family Medicine

## 2024-07-03 DIAGNOSIS — F321 Major depressive disorder, single episode, moderate: Secondary | ICD-10-CM

## 2024-07-04 ENCOUNTER — Ambulatory Visit: Payer: Self-pay | Admitting: Family Medicine

## 2024-07-16 ENCOUNTER — Other Ambulatory Visit

## 2024-07-16 ENCOUNTER — Other Ambulatory Visit: Payer: Self-pay | Admitting: Family Medicine

## 2024-07-16 DIAGNOSIS — N529 Male erectile dysfunction, unspecified: Secondary | ICD-10-CM

## 2024-07-16 LAB — TESTOSTERONE: Testosterone: 235.15 ng/dL — ABNORMAL LOW (ref 300.00–890.00)

## 2024-07-17 ENCOUNTER — Ambulatory Visit: Payer: Self-pay | Admitting: Family Medicine

## 2024-07-19 ENCOUNTER — Ambulatory Visit: Admitting: Psychology

## 2024-07-19 DIAGNOSIS — F4321 Adjustment disorder with depressed mood: Secondary | ICD-10-CM | POA: Diagnosis not present

## 2024-07-19 NOTE — Progress Notes (Signed)
 [facility] Counselor/Therapist Progress Note  Patient ID: Sharon Stapel, MRN: [id number],    Date: 07/19/2024  Time Spent:  60 minutes    start time: 1300    end time: 1400  Treatment Type: Individual Therapy  Reported Symptoms: Pt presents for session, via Caregility video, granting consent for the session.  Pt shares he is in his office with no one else present and understands the limits of virtual sessions.  I shared with pt that I am in my office with no one else here.    Mental Status Exam: Appearance:  Casual     Behavior: Appropriate  Motor: Normal  Speech/Language:  Clear and Coherent  Affect: Appropriate  Mood: depressed  Thought process: normal  Thought content:   WNL  Sensory/Perceptual disturbances:   WNL  Orientation: oriented to person, place, and time/date  Attention: Good  Concentration: Good  Memory: WNL  Fund of knowledge:  Good  Insight:   Good  Judgment:  Good  Impulse Control: Good   Risk Assessment: Danger to Self:  No Self-injurious Behavior: No Danger to Others: No Duty to Warn:no Physical Aggression / Violence:No  Access to Firearms a concern: No  Gang Involvement:No   Subjective: Pt shares that, I have been pretty good since our last session.  We had a good Thanksgiving; we had [employer] for the meal and that was great.  We then took a short trip to [place] for several days; we are going to [place] the day after Christmas until New Year's eve.  Pt also shares that [name] has still asked for a dog and they are going to get a [employer]anner at the end of January from a breeder in [place], [place].  They have recently gone to [place] to see their Christmas decorations.  They are going to [place] over Spring Break 2026.  Pt shares that [name] is still engaging in her training for her new position; she is still hopeful that the position will turn into a full time, permanent position.  She will likely finish her training  in later January to early February.  Pt shares he feels that he and [name] are getting along very well of late.  He is still having some ED issues which is impacting their intimacy; [name] has been willing to pursue their physical intimacy in spite of his physical issues.  Pt does share that he feels like he is the only one initiating intimacy and he would like for her to do some of that too.  Pt reminds me that [name] is 45 yo and is talking to pt about peri-menopause symptoms that she is beginning to feel.  Encouraged pt to talk with [name] about what she is experiencing in an effort to better support her.  Pt again shares that he and [name] tend not to spend much time with his parents because [name] is estranged from his parents and he has had relationship issues with his mom too.  Pt has purchased gifts for his parents for Christmas.  His dad gave pt a Christmas card and it had a used [employer] card in it.  There is $17.00 left on a $50.00 card.  This had implications for for pt.  Talked about ways to talk with his dad about this situation.  Pt shares that his parents are as much responsible for the rift between them and [place].  Pt would like to have this parents and [name] be able to reconcile  but questions whether or not they can.  Pt shares that [name]'s sleeping situation has improved but it has not been without hiccups.  Her therapist continues to work with [name] and pt and [name] on this topic.   Pt continues to take his Wellbutrin  daily and believes that it is being beneficial for him.  Encouraged pt to continue with his self care activities and we will meet in 4 wks for a follow up session.  Interventions: Cognitive Behavioral Therapy  Diagnosis:Adjustment disorder with depressed mood  Plan: Treatment Plan Strengths/Abilities:  Intelligent, Intuitive, Willing to participate in therapy Treatment Preferences:  Outpatient Individual Therapy Statement of Needs:  Patient is to use  CBT, mindfulness and coping skills to help manage and/or decrease symptoms associated with their diagnosis. Symptoms:  Depressed/Irritable mood, worry, social withdrawal Problems Addressed:  Depressive thoughts, Sadness, Sleep issues, etc. Long Term Goals:  Pt to reduce overall level, frequency, and intensity of the feelings of depression as evidenced by decreased irritability, negative self talk, and helpless feelings from 6 to 7 days/week to 0 to 1 days/week, per client report, for at least 3 consecutive months.  Progress: 30% Short Term Goals:  Pt to verbally express understanding of the relationship between feelings of depression and their impact on thinking patterns and behaviors.  Pt to verbalize an understanding of the role that distorted thinking plays in creating fears, excessive worry, and ruminations.  Progress: 30% Target Date:  12/11/2024 Frequency:  Bi-weekly Modality:  Cognitive Behavioral Therapy Interventions by Therapist:  Therapist will use CBT, Mindfulness exercises, Coping skills and Referrals, as needed by client. Client has verbally approved this treatment plan.  Francis KATHEE Macintosh, [facility]

## 2024-08-07 ENCOUNTER — Encounter: Payer: Self-pay | Admitting: Family Medicine

## 2024-08-16 ENCOUNTER — Other Ambulatory Visit

## 2024-08-16 ENCOUNTER — Ambulatory Visit: Admitting: Psychology

## 2024-08-16 DIAGNOSIS — F4321 Adjustment disorder with depressed mood: Secondary | ICD-10-CM | POA: Diagnosis not present

## 2024-08-16 NOTE — Progress Notes (Signed)
 "  [facility] Counselor/Therapist Progress Note  Patient ID: George Singleton, MRN: [id number],    Date: 08/16/2024  Time Spent:  60 minutes    start time: 1300    end time: 1400  Treatment Type: Individual Therapy  Reported Symptoms: Pt presents for session, in person in the office, granting consent for the session.    Mental Status Exam: Appearance:  Casual     Behavior: Appropriate  Motor: Normal  Speech/Language:  Clear and Coherent  Affect: Appropriate  Mood: depressed  Thought process: normal  Thought content:   WNL  Sensory/Perceptual disturbances:   WNL  Orientation: oriented to person, place, and time/date  Attention: Good  Concentration: Good  Memory: WNL  Fund of knowledge:  Good  Insight:   Good  Judgment:  Good  Impulse Control: Good   Risk Assessment: Danger to Self:  No Self-injurious Behavior: No Danger to Others: No Duty to Warn:no Physical Aggression / Violence:No  Access to Firearms a concern: No  Gang Involvement:No   Subjective: Pt shares that, I have been pretty good since our last session.  The holidays were nice; we went to [facility] for several nights and that was nice.  It was pretty to be there.  Saturday is the day we pick up the [employer].  They currently have a toy poodle and a cat.  George Singleton is very excited about it.  Pt shares some of his struggles about talking with George Singleton about money and how they need to be careful with their expenditures.  Talked with pt about financial resources that can help them get closer to being on the same page financially.  They have a timeshare and they are going to [place] in June.  He is somewhat concerned that George Singleton has certain expectations for gifts, travel, etc.  They are going to [place] over Spring Break 2026.  Pt shares that George Singleton is still engaging in her training for her new position; she is still hopeful that the position will turn into a full time, permanent position.  She  will likely finish her training in later January to early February.  Pt shares he feels that he and George Singleton are getting along pretty well of late; intimacy is better than it was 4-5 months ago; I am still seeing a specialist for ED and we are making progress.  Pt shares that George Singleton's sleeping situation has improved but it has not been without hiccups.  Her therapist continues to work with George Singleton and pt and George Singleton on this topic.  Pt continues to take his Wellbutrin  daily and believes that it is being beneficial for him.  Encouraged pt to continue with his self care activities and we will meet in 2 wks for a follow up session.  Interventions: Cognitive Behavioral Therapy  Diagnosis:Adjustment disorder with depressed mood  Plan: Treatment Plan Strengths/Abilities:  Intelligent, Intuitive, Willing to participate in therapy Treatment Preferences:  Outpatient Individual Therapy Statement of Needs:  Patient is to use CBT, mindfulness and coping skills to help manage and/or decrease symptoms associated with their diagnosis. Symptoms:  Depressed/Irritable mood, worry, social withdrawal Problems Addressed:  Depressive thoughts, Sadness, Sleep issues, etc. Long Term Goals:  Pt to reduce overall level, frequency, and intensity of the feelings of depression as evidenced by decreased irritability, negative self talk, and helpless feelings from 6 to 7 days/week to 0 to 1 days/week, per client report, for at least 3 consecutive months.  Progress: 30% Short Term Goals:  Pt to verbally  express understanding of the relationship between feelings of depression and their impact on thinking patterns and behaviors.  Pt to verbalize an understanding of the role that distorted thinking plays in creating fears, excessive worry, and ruminations.  Progress: 30% Target Date:  12/11/2024 Frequency:  Bi-weekly Modality:  Cognitive Behavioral Therapy Interventions by Therapist:  Therapist will use CBT, Mindfulness exercises,  Coping skills and Referrals, as needed by client. Client has verbally approved this treatment plan.  George Singleton, [facility] "

## 2024-09-06 ENCOUNTER — Ambulatory Visit: Admitting: Psychology

## 2024-09-06 DIAGNOSIS — F4321 Adjustment disorder with depressed mood: Secondary | ICD-10-CM

## 2024-09-06 NOTE — Progress Notes (Signed)
 "  [facility] Counselor/Therapist Progress Note  Patient ID: Dontai Pember, MRN: [id number],    Date: 09/06/2024  Time Spent:  60 minutes    start time: 1300    end time: 1400  Treatment Type: Individual Therapy  Reported Symptoms: Pt presents for session, in person in the office, granting consent for the session.    Mental Status Exam: Appearance:  Casual     Behavior: Appropriate  Motor: Normal  Speech/Language:  Clear and Coherent  Affect: Appropriate  Mood: depressed  Thought process: normal  Thought content:   WNL  Sensory/Perceptual disturbances:   WNL  Orientation: oriented to person, place, and time/date  Attention: Good  Concentration: Good  Memory: WNL  Fund of knowledge:  Good  Insight:   Good  Judgment:  Good  Impulse Control: Good   Risk Assessment: Danger to Self:  No Self-injurious Behavior: No Danger to Others: No Duty to Warn:no Physical Aggression / Violence:No  Access to Firearms a concern: No  Gang Involvement:No   Subjective: Pt shares that, I have been pretty good since our last session.  We got the [employer] puppy Corbin) and she is adorable.  Sometimes, she makes it through the night without needing to go outside.  I am the one who gets up with her in the middle of the night.  She is already bigger than the toy poodle [facility]) we have.  [name] has been doing remote learning for the past two weeks and that is not great.  I am the one who is having to make sure she gets her work done.  I have also been playing with her in the snow.  [name] has been sick so I have been doing most of the stuff at home.  Pt shares that [name] fell off of her sled and he had to take her to [facility] for an office visit; everything was fine.  They are now allowing [name] to get a set allowance per week and she earns it from cleaning up after herself with anything she does in the home.  [name] continues to work on her training activities at her new  job.  The producer, television/film/video will come out soon to build the fence in their back yard for [employer].  Pt has seen a urologist recently and he got prescriptions for ED medication and also testosterone  replacement.  He is feeling better and feeling back to his old self.  Pt continues to take [name] to her therapist a couple of times per month; the therapist normally checks in with pt at the end of their sessions.  They have a timeshare and they are going to [place] in June.  They are going to [place] over Spring Break 2026.  Pt continues to take his Wellbutrin  daily and believes that it is being beneficial for him.  Encouraged pt to continue with his self care activities and we will meet in 3 wks for a follow up session.  Interventions: Cognitive Behavioral Therapy  Diagnosis:Adjustment disorder with depressed mood  Plan: Treatment Plan Strengths/Abilities:  Intelligent, Intuitive, Willing to participate in therapy Treatment Preferences:  Outpatient Individual Therapy Statement of Needs:  Patient is to use CBT, mindfulness and coping skills to help manage and/or decrease symptoms associated with their diagnosis. Symptoms:  Depressed/Irritable mood, worry, social withdrawal Problems Addressed:  Depressive thoughts, Sadness, Sleep issues, etc. Long Term Goals:  Pt to reduce overall level, frequency, and intensity of the feelings of depression as evidenced  by decreased irritability, negative self talk, and helpless feelings from 6 to 7 days/week to 0 to 1 days/week, per client report, for at least 3 consecutive months.  Progress: 30% Short Term Goals:  Pt to verbally express understanding of the relationship between feelings of depression and their impact on thinking patterns and behaviors.  Pt to verbalize an understanding of the role that distorted thinking plays in creating fears, excessive worry, and ruminations.  Progress: 30% Target Date:  12/11/2024 Frequency:  Bi-weekly Modality:  Cognitive  Behavioral Therapy Interventions by Therapist:  Therapist will use CBT, Mindfulness exercises, Coping skills and Referrals, as needed by client. Client has verbally approved this treatment plan.  Francis KATHEE Macintosh, [facility] "

## 2024-09-27 ENCOUNTER — Ambulatory Visit: Admitting: Psychology

## 2024-10-10 ENCOUNTER — Ambulatory Visit: Admitting: Cardiovascular Disease

## 2024-11-12 ENCOUNTER — Ambulatory Visit: Admitting: Family Medicine
